# Patient Record
Sex: Female | Born: 1955 | Race: Black or African American | Hispanic: No | Smoking: Never smoker
Health system: Southern US, Community
[De-identification: ages and names within clinical notes are randomized; demographics above are authoritative.]

## PROBLEM LIST (undated history)

## (undated) ENCOUNTER — Ambulatory Visit: Admission: EM | Discharge status: Absent without leave | Payer: Self-pay

## (undated) DIAGNOSIS — Z9889 Other specified postprocedural states: Secondary | ICD-10-CM

## (undated) DIAGNOSIS — D649 Anemia, unspecified: Secondary | ICD-10-CM

## (undated) DIAGNOSIS — C50919 Malignant neoplasm of unspecified site of unspecified female breast: Secondary | ICD-10-CM

## (undated) DIAGNOSIS — Z923 Personal history of irradiation: Secondary | ICD-10-CM

## (undated) HISTORY — PX: BREAST LUMPECTOMY: SHX2

## (undated) HISTORY — DX: Malignant neoplasm of unspecified site of unspecified female breast: C50.919

## (undated) HISTORY — PX: ROTATOR CUFF REPAIR: SHX139

## (undated) HISTORY — PX: ABDOMINAL HYSTERECTOMY: SHX81

## (undated) HISTORY — DX: Other specified postprocedural states: Z98.890

## (undated) HISTORY — PX: BREAST SURGERY: SHX581

## (undated) HISTORY — DX: Anemia, unspecified: D64.9

## (undated) HISTORY — PX: CHOLECYSTECTOMY: SHX55

---

## 1998-10-06 ENCOUNTER — Encounter: Admission: RE | Admit: 1998-10-06 | Discharge: 1998-10-06 | Payer: Self-pay | Admitting: Obstetrics

## 1998-10-18 ENCOUNTER — Ambulatory Visit (HOSPITAL_COMMUNITY): Admission: RE | Admit: 1998-10-18 | Discharge: 1998-10-18 | Payer: Self-pay

## 2003-01-18 ENCOUNTER — Emergency Department (HOSPITAL_COMMUNITY): Admission: EM | Admit: 2003-01-18 | Discharge: 2003-01-18 | Payer: Self-pay | Admitting: Emergency Medicine

## 2003-01-19 ENCOUNTER — Emergency Department (HOSPITAL_COMMUNITY): Admission: EM | Admit: 2003-01-19 | Discharge: 2003-01-19 | Payer: Self-pay | Admitting: Emergency Medicine

## 2006-10-29 ENCOUNTER — Emergency Department (HOSPITAL_COMMUNITY): Admission: EM | Admit: 2006-10-29 | Discharge: 2006-10-29 | Payer: Self-pay | Admitting: Emergency Medicine

## 2009-11-22 ENCOUNTER — Encounter: Admission: RE | Admit: 2009-11-22 | Discharge: 2009-11-22 | Payer: Self-pay | Admitting: Obstetrics and Gynecology

## 2009-11-24 ENCOUNTER — Encounter: Admission: RE | Admit: 2009-11-24 | Discharge: 2009-11-24 | Payer: Self-pay | Admitting: Obstetrics and Gynecology

## 2009-11-29 ENCOUNTER — Encounter: Admission: RE | Admit: 2009-11-29 | Discharge: 2009-11-29 | Payer: Self-pay | Admitting: Obstetrics and Gynecology

## 2009-12-02 ENCOUNTER — Ambulatory Visit: Payer: Self-pay | Admitting: Oncology

## 2009-12-07 LAB — CBC WITH DIFFERENTIAL/PLATELET
BASO%: 0.2 % (ref 0.0–2.0)
Basophils Absolute: 0 10*3/uL (ref 0.0–0.1)
EOS%: 2.8 % (ref 0.0–7.0)
Eosinophils Absolute: 0.2 10*3/uL (ref 0.0–0.5)
HCT: 36.6 % (ref 34.8–46.6)
HGB: 10.8 g/dL — ABNORMAL LOW (ref 11.6–15.9)
LYMPH%: 28.5 % (ref 14.0–49.7)
MCH: 19.4 pg — ABNORMAL LOW (ref 25.1–34.0)
MCHC: 29.5 g/dL — ABNORMAL LOW (ref 31.5–36.0)
MCV: 65.7 fL — ABNORMAL LOW (ref 79.5–101.0)
MONO#: 0.4 10*3/uL (ref 0.1–0.9)
MONO%: 6.9 % (ref 0.0–14.0)
NEUT#: 3.5 10*3/uL (ref 1.5–6.5)
NEUT%: 61.6 % (ref 38.4–76.8)
Platelets: 289 10*3/uL (ref 145–400)
RBC: 5.57 10*6/uL — ABNORMAL HIGH (ref 3.70–5.45)
RDW: 25.5 % — ABNORMAL HIGH (ref 11.2–14.5)
WBC: 5.7 10*3/uL (ref 3.9–10.3)
lymph#: 1.6 10*3/uL (ref 0.9–3.3)

## 2009-12-07 LAB — COMPREHENSIVE METABOLIC PANEL
ALT: 13 U/L (ref 0–35)
AST: 14 U/L (ref 0–37)
Albumin: 4.1 g/dL (ref 3.5–5.2)
Alkaline Phosphatase: 43 U/L (ref 39–117)
BUN: 11 mg/dL (ref 6–23)
CO2: 24 mEq/L (ref 19–32)
Calcium: 9.3 mg/dL (ref 8.4–10.5)
Chloride: 105 mEq/L (ref 96–112)
Creatinine, Ser: 0.73 mg/dL (ref 0.40–1.20)
Glucose, Bld: 102 mg/dL — ABNORMAL HIGH (ref 70–99)
Potassium: 4.5 mEq/L (ref 3.5–5.3)
Sodium: 137 mEq/L (ref 135–145)
Total Bilirubin: 0.2 mg/dL — ABNORMAL LOW (ref 0.3–1.2)
Total Protein: 7.4 g/dL (ref 6.0–8.3)

## 2009-12-07 LAB — LACTATE DEHYDROGENASE: LDH: 158 U/L (ref 94–250)

## 2009-12-07 LAB — CANCER ANTIGEN 27.29: CA 27.29: 60 U/mL — ABNORMAL HIGH (ref 0–39)

## 2009-12-12 ENCOUNTER — Ambulatory Visit (HOSPITAL_COMMUNITY): Admission: RE | Admit: 2009-12-12 | Discharge: 2009-12-12 | Payer: Self-pay | Admitting: Oncology

## 2009-12-13 ENCOUNTER — Encounter: Admission: RE | Admit: 2009-12-13 | Discharge: 2009-12-13 | Payer: Self-pay | Admitting: Oncology

## 2009-12-14 ENCOUNTER — Encounter: Payer: Self-pay | Admitting: Oncology

## 2009-12-14 ENCOUNTER — Ambulatory Visit: Admission: RE | Admit: 2009-12-14 | Discharge: 2009-12-14 | Payer: Self-pay | Admitting: Oncology

## 2009-12-14 ENCOUNTER — Ambulatory Visit: Payer: Self-pay | Admitting: Cardiology

## 2009-12-19 ENCOUNTER — Encounter (INDEPENDENT_AMBULATORY_CARE_PROVIDER_SITE_OTHER): Payer: Self-pay | Admitting: Obstetrics and Gynecology

## 2009-12-19 ENCOUNTER — Inpatient Hospital Stay (HOSPITAL_COMMUNITY): Admission: RE | Admit: 2009-12-19 | Discharge: 2009-12-22 | Payer: Self-pay | Admitting: Obstetrics and Gynecology

## 2010-01-19 ENCOUNTER — Ambulatory Visit: Payer: Self-pay | Admitting: Oncology

## 2010-03-07 ENCOUNTER — Ambulatory Visit: Payer: Self-pay | Admitting: Oncology

## 2010-03-09 LAB — COMPREHENSIVE METABOLIC PANEL
ALT: 11 U/L (ref 0–35)
Albumin: 4.3 g/dL (ref 3.5–5.2)
Alkaline Phosphatase: 51 U/L (ref 39–117)
BUN: 16 mg/dL (ref 6–23)
CO2: 25 mEq/L (ref 19–32)
Calcium: 10 mg/dL (ref 8.4–10.5)
Chloride: 102 mEq/L (ref 96–112)
Glucose, Bld: 93 mg/dL (ref 70–99)
Potassium: 4.5 mEq/L (ref 3.5–5.3)
Sodium: 138 mEq/L (ref 135–145)
Total Bilirubin: 0.2 mg/dL — ABNORMAL LOW (ref 0.3–1.2)
Total Protein: 7.9 g/dL (ref 6.0–8.3)

## 2010-03-09 LAB — CBC WITH DIFFERENTIAL/PLATELET
BASO%: 0.3 % (ref 0.0–2.0)
Basophils Absolute: 0 10*3/uL (ref 0.0–0.1)
EOS%: 2.3 % (ref 0.0–7.0)
Eosinophils Absolute: 0.1 10*3/uL (ref 0.0–0.5)
HCT: 35.5 % (ref 34.8–46.6)
HGB: 11.3 g/dL — ABNORMAL LOW (ref 11.6–15.9)
LYMPH%: 24.8 % (ref 14.0–49.7)
MCH: 22.1 pg — ABNORMAL LOW (ref 25.1–34.0)
MCHC: 32 g/dL (ref 31.5–36.0)
MCV: 68.9 fL — ABNORMAL LOW (ref 79.5–101.0)
MONO#: 0.3 10*3/uL (ref 0.1–0.9)
NEUT#: 3.6 10*3/uL (ref 1.5–6.5)
NEUT%: 66.8 % (ref 38.4–76.8)
RBC: 5.14 10*6/uL (ref 3.70–5.45)
RDW: 16.9 % — ABNORMAL HIGH (ref 11.2–14.5)
WBC: 5.3 10*3/uL (ref 3.9–10.3)
lymph#: 1.3 10*3/uL (ref 0.9–3.3)

## 2010-03-16 ENCOUNTER — Other Ambulatory Visit: Payer: Self-pay | Admitting: Oncology

## 2010-03-16 ENCOUNTER — Encounter: Payer: BC Managed Care – PPO | Admitting: Oncology

## 2010-03-16 DIAGNOSIS — Z853 Personal history of malignant neoplasm of breast: Secondary | ICD-10-CM

## 2010-03-29 ENCOUNTER — Ambulatory Visit
Admission: RE | Admit: 2010-03-29 | Discharge: 2010-03-29 | Disposition: A | Discharge status: Home / Self Care | Payer: BC Managed Care – PPO | Source: Ambulatory Visit | Attending: Oncology | Admitting: Oncology

## 2010-03-29 ENCOUNTER — Encounter (INDEPENDENT_AMBULATORY_CARE_PROVIDER_SITE_OTHER): Payer: BC Managed Care – PPO | Admitting: Vascular Surgery

## 2010-03-29 DIAGNOSIS — I781 Nevus, non-neoplastic: Secondary | ICD-10-CM

## 2010-03-29 DIAGNOSIS — Z853 Personal history of malignant neoplasm of breast: Secondary | ICD-10-CM

## 2010-03-30 NOTE — Assessment & Plan Note (Signed)
OFFICE VISIT  [name], [name] DOB:  Mar 28, 1955                                       03/29/2010 FAOZH#:[id number]  This patient presents today for concern regarding venous pathology in her lower extremities bilaterally.  She is a very pleasant 55 year old white female with discomfort over a very pronounced telangiectasia over her lateral and medial thighs.  She works as a  C.R.N.A. Scientist, clinical (histocompatibility and immunogenetics) and stands for great periods of time.  She does report pain, itching and burning sensation over these with prolonged standing.  She does not have any history of DVT or other more serious venous pathology.  PAST MEDICAL HISTORY:  Significant for breast cancer for which is not currently receiving chemotherapy with a plan for eventual lumpectomy. She also has a history of total hysterectomy.  SOCIAL HISTORY:  She is single with 4 children.  She does not smoke or drink alcohol.  FAMILY HISTORY:  Positive for coronary bypass grafting in her mother.  REVIEW OF SYSTEMS:  No weight loss or gain.  She weighs 176 pounds.  She is 5 foot 1 inch tall.  Review of systems otherwise negative.  PHYSICAL EXAMINATION:  Well developed, well nourished black female appearing stated age in no acute distress.  Blood pressure is 125/82, pulse 83, respirations 18.  HEENT:  Normal.  She has 2+ radial and 2+ dorsalis pedis pulses bilaterally.  Musculoskeletal:  Shows no major deformity or cyanosis.  Neurologic:  No focal paresthesias.  Skin: Without ulcers or rashes.  She does have extensive raised telangiectasia over both lateral thighs and medial thighs.  I imaged her veins with SonoSite ultrasound and this shows no evidence of gross reflux in her saphenous veins bilaterally.  I discussed this at length with the patient.  I explained that she does not have a more serious underlying venous putting her at any increased risk for PE and or other more serious problems, I did explain that  these telangiectasia could be treated with sclerotherapy.  She wishes to proceed with this. She understands this would not be covered by insurance and I did explain the significant amount of out of  pocket expense.  We will schedule this at her convenience.    Larina Earthly, M.D.  TFE/MEDQ  D:  03/29/2010  T:  03/30/2010  Job:  [id number]

## 2010-04-25 LAB — COMPREHENSIVE METABOLIC PANEL
ALT: 16 U/L (ref 0–35)
AST: 20 U/L (ref 0–37)
Albumin: 4.1 g/dL (ref 3.5–5.2)
Alkaline Phosphatase: 40 U/L (ref 39–117)
BUN: 5 mg/dL — ABNORMAL LOW (ref 6–23)
CO2: 27 mEq/L (ref 19–32)
Calcium: 9.5 mg/dL (ref 8.4–10.5)
Chloride: 104 mEq/L (ref 96–112)
Creatinine, Ser: 0.59 mg/dL (ref 0.4–1.2)
GFR calc Af Amer: >60 mL/min (ref >60)
GFR calc non Af Amer: >60 mL/min (ref >60)
Glucose, Bld: 85 mg/dL (ref 70–99)
Potassium: 3.8 mEq/L (ref 3.5–5.1)
Sodium: 136 mEq/L (ref 135–145)
Total Bilirubin: 0.4 mg/dL (ref 0.3–1.2)
Total Protein: 7.9 g/dL (ref 6.0–8.3)

## 2010-04-25 LAB — CBC
HCT: 30.5 % — ABNORMAL LOW (ref 36.0–46.0)
HCT: 36.2 % (ref 36.0–46.0)
Hemoglobin: 11.1 g/dL — ABNORMAL LOW (ref 12.0–15.0)
Hemoglobin: 9.5 g/dL — ABNORMAL LOW (ref 12.0–15.0)
MCH: 20.2 pg — ABNORMAL LOW (ref 26.0–34.0)
MCH: 20.8 pg — ABNORMAL LOW (ref 26.0–34.0)
MCHC: 30.6 g/dL (ref 30.0–36.0)
MCHC: 31.2 g/dL (ref 30.0–36.0)
MCV: 66 fL — ABNORMAL LOW (ref 78.0–100.0)
MCV: 66.5 fL — ABNORMAL LOW (ref 78.0–100.0)
Platelets: 171 10*3/uL (ref 150–400)
Platelets: 214 10*3/uL (ref 150–400)
RBC: 4.59 MIL/uL (ref 3.87–5.11)
RBC: 5.49 MIL/uL — ABNORMAL HIGH (ref 3.87–5.11)
RDW: 27.3 % — ABNORMAL HIGH (ref 11.5–15.5)
RDW: 28 % — ABNORMAL HIGH (ref 11.5–15.5)
WBC: 4.5 10*3/uL (ref 4.0–10.5)
WBC: 7.5 10*3/uL (ref 4.0–10.5)

## 2010-04-25 LAB — PREGNANCY, URINE: Preg Test, Ur: NEGATIVE

## 2010-04-25 LAB — SURGICAL PCR SCREEN
MRSA, PCR: NEGATIVE
Staphylococcus aureus: NEGATIVE

## 2010-04-26 LAB — GLUCOSE, CAPILLARY: Glucose-Capillary: 101 mg/dL — ABNORMAL HIGH (ref 70–99)

## 2010-05-10 ENCOUNTER — Other Ambulatory Visit: Payer: Self-pay | Admitting: Oncology

## 2010-05-10 ENCOUNTER — Encounter (HOSPITAL_BASED_OUTPATIENT_CLINIC_OR_DEPARTMENT_OTHER): Payer: BC Managed Care – PPO | Admitting: Oncology

## 2010-05-10 DIAGNOSIS — C50119 Malignant neoplasm of central portion of unspecified female breast: Secondary | ICD-10-CM

## 2010-05-10 LAB — CBC WITH DIFFERENTIAL/PLATELET
Basophils Absolute: 0.1 10*3/uL (ref 0.0–0.1)
EOS%: 2.4 % (ref 0.0–7.0)
Eosinophils Absolute: 0.1 10*3/uL (ref 0.0–0.5)
HCT: 38.2 % (ref 34.8–46.6)
HGB: 11.9 g/dL (ref 11.6–15.9)
LYMPH%: 23.4 % (ref 14.0–49.7)
MCH: 22.3 pg — ABNORMAL LOW (ref 25.1–34.0)
MCHC: 31.2 g/dL — ABNORMAL LOW (ref 31.5–36.0)
MCV: 71.4 fL — ABNORMAL LOW (ref 79.5–101.0)
MONO#: 0.3 10*3/uL (ref 0.1–0.9)
NEUT#: 3.9 10*3/uL (ref 1.5–6.5)
NEUT%: 67.4 % (ref 38.4–76.8)
RBC: 5.36 10*6/uL (ref 3.70–5.45)
RDW: 17.8 % — ABNORMAL HIGH (ref 11.2–14.5)
WBC: 5.7 10*3/uL (ref 3.9–10.3)
lymph#: 1.3 10*3/uL (ref 0.9–3.3)

## 2010-05-11 LAB — COMPREHENSIVE METABOLIC PANEL
ALT: 17 U/L (ref 0–35)
AST: 19 U/L (ref 0–37)
Alkaline Phosphatase: 55 U/L (ref 39–117)
CO2: 26 mEq/L (ref 19–32)
Calcium: 9.8 mg/dL (ref 8.4–10.5)
Chloride: 102 mEq/L (ref 96–112)
Creatinine, Ser: 0.94 mg/dL (ref 0.40–1.20)
Glucose, Bld: 98 mg/dL (ref 70–99)
Potassium: 4.4 mEq/L (ref 3.5–5.3)
Sodium: 137 mEq/L (ref 135–145)
Total Bilirubin: 0.3 mg/dL (ref 0.3–1.2)
Total Protein: 7.5 g/dL (ref 6.0–8.3)

## 2010-05-11 LAB — VITAMIN D 25 HYDROXY (VIT D DEFICIENCY, FRACTURES): Vit D, 25-Hydroxy: 16 ng/mL — ABNORMAL LOW (ref 30–89)

## 2010-05-22 ENCOUNTER — Other Ambulatory Visit: Payer: Self-pay | Admitting: General Surgery

## 2010-05-22 DIAGNOSIS — C50911 Malignant neoplasm of unspecified site of right female breast: Secondary | ICD-10-CM

## 2010-06-13 ENCOUNTER — Ambulatory Visit
Admission: RE | Admit: 2010-06-13 | Discharge: 2010-06-13 | Disposition: A | Discharge status: Home / Self Care | Payer: BC Managed Care – PPO | Source: Ambulatory Visit | Attending: General Surgery | Admitting: General Surgery

## 2010-06-13 DIAGNOSIS — C50911 Malignant neoplasm of unspecified site of right female breast: Secondary | ICD-10-CM

## 2010-06-13 MED ORDER — GADOBENATE DIMEGLUMINE 529 MG/ML IV SOLN
18.0000 mL | Freq: Once | INTRAVENOUS | Status: AC | PRN
  Administered 2010-06-13: 18 mL via INTRAVENOUS

## 2010-06-19 ENCOUNTER — Other Ambulatory Visit (HOSPITAL_COMMUNITY): Payer: Self-pay | Admitting: General Surgery

## 2010-06-19 ENCOUNTER — Other Ambulatory Visit: Payer: Self-pay | Admitting: General Surgery

## 2010-06-19 DIAGNOSIS — C50919 Malignant neoplasm of unspecified site of unspecified female breast: Secondary | ICD-10-CM

## 2010-06-19 DIAGNOSIS — C50911 Malignant neoplasm of unspecified site of right female breast: Secondary | ICD-10-CM

## 2010-06-20 ENCOUNTER — Encounter (INDEPENDENT_AMBULATORY_CARE_PROVIDER_SITE_OTHER): Payer: Self-pay | Admitting: General Surgery

## 2010-07-17 ENCOUNTER — Encounter (HOSPITAL_BASED_OUTPATIENT_CLINIC_OR_DEPARTMENT_OTHER)
Admission: RE | Admit: 2010-07-17 | Discharge: 2010-07-17 | Disposition: A | Discharge status: Home / Self Care | Payer: BC Managed Care – PPO | Source: Ambulatory Visit | Attending: General Surgery | Admitting: General Surgery

## 2010-07-17 ENCOUNTER — Other Ambulatory Visit (HOSPITAL_COMMUNITY): Payer: BC Managed Care – PPO

## 2010-07-17 LAB — CBC
Hemoglobin: 13.1 g/dL (ref 12.0–15.0)
MCH: 22.8 pg — ABNORMAL LOW (ref 26.0–34.0)
RBC: 5.74 MIL/uL — ABNORMAL HIGH (ref 3.87–5.11)
WBC: 7.4 10*3/uL (ref 4.0–10.5)

## 2010-07-17 LAB — COMPREHENSIVE METABOLIC PANEL
AST: 20 U/L (ref 0–37)
Albumin: 3.6 g/dL (ref 3.5–5.2)
BUN: 14 mg/dL (ref 6–23)
Calcium: 9.9 mg/dL (ref 8.4–10.5)
Creatinine, Ser: 0.99 mg/dL (ref 0.4–1.2)
GFR calc Af Amer: >60 mL/min (ref >60)
Total Protein: 7.8 g/dL (ref 6.0–8.3)

## 2010-07-17 LAB — URINE MICROSCOPIC-ADD ON

## 2010-07-17 LAB — URINALYSIS, ROUTINE W REFLEX MICROSCOPIC
Glucose, UA: NEGATIVE mg/dL
Ketones, ur: NEGATIVE mg/dL
Specific Gravity, Urine: 1.024 (ref 1.005–1.030)
pH: 5 (ref 5.0–8.0)

## 2010-07-17 LAB — DIFFERENTIAL
Basophils Relative: 0 % (ref 0–1)
Monocytes Relative: 6 % (ref 3–12)
Neutro Abs: 4.3 10*3/uL (ref 1.7–7.7)
Neutrophils Relative %: 58 % (ref 43–77)

## 2010-07-18 ENCOUNTER — Ambulatory Visit (HOSPITAL_COMMUNITY)
Admission: RE | Admit: 2010-07-18 | Discharge: 2010-07-18 | Disposition: A | Discharge status: Home / Self Care | Payer: BC Managed Care – PPO | Source: Ambulatory Visit | Attending: General Surgery | Admitting: General Surgery

## 2010-07-18 ENCOUNTER — Other Ambulatory Visit (INDEPENDENT_AMBULATORY_CARE_PROVIDER_SITE_OTHER): Payer: Self-pay | Admitting: General Surgery

## 2010-07-18 ENCOUNTER — Ambulatory Visit
Admission: RE | Admit: 2010-07-18 | Discharge: 2010-07-18 | Disposition: A | Discharge status: Home / Self Care | Payer: BC Managed Care – PPO | Source: Ambulatory Visit | Attending: General Surgery | Admitting: General Surgery

## 2010-07-18 ENCOUNTER — Ambulatory Visit (HOSPITAL_BASED_OUTPATIENT_CLINIC_OR_DEPARTMENT_OTHER)
Admission: RE | Admit: 2010-07-18 | Discharge: 2010-07-18 | Disposition: A | Discharge status: Home / Self Care | Payer: BC Managed Care – PPO | Source: Ambulatory Visit | Attending: General Surgery | Admitting: General Surgery

## 2010-07-18 DIAGNOSIS — C50919 Malignant neoplasm of unspecified site of unspecified female breast: Secondary | ICD-10-CM | POA: Insufficient documentation

## 2010-07-18 DIAGNOSIS — Z01818 Encounter for other preprocedural examination: Secondary | ICD-10-CM | POA: Insufficient documentation

## 2010-07-18 DIAGNOSIS — C50911 Malignant neoplasm of unspecified site of right female breast: Secondary | ICD-10-CM

## 2010-07-18 DIAGNOSIS — Z01812 Encounter for preprocedural laboratory examination: Secondary | ICD-10-CM | POA: Insufficient documentation

## 2010-07-18 MED ORDER — TECHNETIUM TC 99M SULFUR COLLOID FILTERED
1.0000 | Freq: Once | INTRAVENOUS | Status: AC | PRN
  Administered 2010-07-18: 1 via INTRADERMAL

## 2010-07-20 NOTE — Op Note (Addendum)
[name], [name]           ACCOUNT NO.:  [id number]  MEDICAL RECORD NO.:  [id number]  LOCATION:  [facility]                          FACILITY:  [facility]  PHYSICIAN:  Angelia Mould. Derrell Lolling, M.D.DATE OF BIRTH:  [birth year]  DATE OF PROCEDURE:  07/18/2010 DATE OF DISCHARGE:                              OPERATIVE REPORT   PREOPERATIVE DIAGNOSES: 1. Invasive ductal carcinoma right breast, multifocal, superior and     central, clinical stage T1bN0. 2. Status post neoadjuvant Femara.  POSTOPERATIVE DIAGNOSES: 1. Invasive ductal carcinoma right breast, multifocal, superior and     central, clinical stage T1bN0. 2. Status post neoadjuvant Femara.  OPERATION PERFORMED: 1. Inject blue dye right breast. 2. Right partial mastectomy with needle localization x2, right     axillary sentinel lymph node mapping and biopsy.  SURGEON:  Angelia Mould. Derrell Lolling, MD  OPERATIVE INDICATIONS:  This is a 56 year old African American femalewho presented in October 2011, with a palpable mass in the right breast at 12 o'clock position.  Imaging at that time showed a 2.3-cm mass which was biopsied and showed invasive ductal carcinoma.  More anteriorly and almost behind the nipple areolar complex was a second area measuring 1.3 cm.  She also had a problem with a huge uterus with uterine fibroids and heavy menstrual bleeding.  She had an MRI of the breast which showed extensive disease in the central and upper part of the right breast with anterior to posterior dimension outside rim to outside rim being 7 cm. In an attempt to facilitate breast conservation surgery, she has been on neoadjuvant Femara since that time.  The tumors had been down-staged fairly significantly.  The more superior and posterior mass at 12 o'clock position was now 0.9 cm, the second focus which was anterior and retroareolar was essentially resolved with only a 3-mm focus and biopsy clips present.  There was no adenopathy.  She wanted to attempt  breast conservation surgery.  She is aware that she will lose a significant volume of tissue from the superior pole of the breast.  She is going to undergo adjuvant radiation therapy.  She is brought to the operating room electively.  OPERATIVE TECHNIQUE:  The patient underwent wire localization x2 of the two small tumors in the superior aspect of the right breast.  With these wires entered anteriorly and directed posteriorly and inferiorly and were well placed around the tumors and marker clips.  She was brought to the holding area at [facility].  The nuclear medicine technician injected radionuclide into the breast.  The patient was taken to the operating room.  General anesthesia was induced.  Intravenous antibiotics were given.  Surgical time-out was held identifying the correct patient, correct procedure and correct site.  Prior to prepping and draping of the breast, I injected the right breast retroareolar area with 5 mL of blue dye, which was a mixture of 2 mL of methylene blue mixed with 3 mL of saline.  The breast was massaged for 5 minutes.  We then prepped and draped the right breast and right axilla. Marcaine 0.5% with epinephrine was used for local infiltration anesthetic.  After review of the specimen mammogram films, I used a marking pen  and marked a radially oriented ellipse superiorly at about the 12:30 position.  The incision was made and dissection was carried down into the breast tissue around the localizing wires.  I went all the way down to the pectoralis fascia because the more superiorly placed mass was very posteriorly.  Inferiorly, we came very close to the wire tip and so I took the specimen out margin with the six color margin marker kit and then re-excised the inferior margin and marked it as well.  The specimen mammogram actually showed that both marker clips and both wires were completely encased within the specimen.  It was a little bit  close on the inferior margin so I did feel justified in taking more inferior tissue.  This was behind the retroareolar area.  Hemostasis was excellent and achieved with electrocautery.  We felt that we had done adequate excision.  We reconstructed the breast tissue as best as possible.  We undermined the breast tissue in pectoralis fascia and brought it together with interrupted sutures of 3-0 Vicryl and in the deep layer.  The more superficially placed breast tissue was closed with interrupted suture of 3-0 Vicryl and the skin closed with running subcuticular suture of 4-0 Monocryl and Dermabond.  There was significant flattening of the superior half of the areolar margin.  We then used the Neoprobe and identified the radioactivity in the axilla.  A transverse incision was made at the hairline.  Dissection was carried down through the subcutaneous tissue and through the clavipectoral fascia.  We dissected out two very hot, very blue lymph nodes and sent those as sentinel node #1 and #2.  We thought that she will be having some other sentinel nodes but when we removed them they had no radioactivity so we simply sent those specimens as additional axillary contents, non sentinel node.  Hemostasis was excellent and achieved with electrocautery.  The deeper axillary tissues were closed with interrupted sutures of 3-0 Vicryl and the skin closed with a running subcuticular suture of 4-0 Monocryl and Dermabond.  Clean bandages were placed and the patient was taken to recovery room in stable condition.  Estimated blood loss was about 25 mL.  Complications none.  Sponge, needle and instrument counts were correct.     Angelia Mould. Derrell Lolling, M.D.   ______________________________ Angelia Mould. Derrell Lolling, M.D.    HMI/MEDQ  D:  07/18/2010  T:  07/19/2010  Job:  [id number]  cc:   Pierce Crane, M.D., F.R.C.P.C. Dineen Kid Rana Snare, M.D.  Electronically Signed by Claud Kelp M.D. on 07/27/2010 05:22:52 PM

## 2010-08-14 ENCOUNTER — Ambulatory Visit
Admission: RE | Admit: 2010-08-14 | Discharge: 2010-08-14 | Disposition: A | Discharge status: Home / Self Care | Payer: BC Managed Care – PPO | Source: Ambulatory Visit | Attending: Radiation Oncology | Admitting: Radiation Oncology

## 2010-08-14 DIAGNOSIS — L538 Other specified erythematous conditions: Secondary | ICD-10-CM | POA: Insufficient documentation

## 2010-08-14 DIAGNOSIS — C50219 Malignant neoplasm of upper-inner quadrant of unspecified female breast: Secondary | ICD-10-CM | POA: Insufficient documentation

## 2010-08-14 DIAGNOSIS — Z510 Encounter for antineoplastic radiation therapy: Secondary | ICD-10-CM | POA: Insufficient documentation

## 2010-08-14 DIAGNOSIS — C50119 Malignant neoplasm of central portion of unspecified female breast: Secondary | ICD-10-CM | POA: Insufficient documentation

## 2010-08-31 ENCOUNTER — Encounter (HOSPITAL_BASED_OUTPATIENT_CLINIC_OR_DEPARTMENT_OTHER): Payer: BC Managed Care – PPO | Admitting: Oncology

## 2010-08-31 ENCOUNTER — Ambulatory Visit: Payer: BC Managed Care – PPO | Admitting: Gynecology

## 2010-08-31 ENCOUNTER — Other Ambulatory Visit: Payer: Self-pay | Admitting: Oncology

## 2010-08-31 ENCOUNTER — Encounter (INDEPENDENT_AMBULATORY_CARE_PROVIDER_SITE_OTHER): Payer: BC Managed Care – PPO | Admitting: General Surgery

## 2010-08-31 DIAGNOSIS — C50119 Malignant neoplasm of central portion of unspecified female breast: Secondary | ICD-10-CM

## 2010-08-31 LAB — CBC WITH DIFFERENTIAL/PLATELET
BASO%: 0.5 % (ref 0.0–2.0)
Basophils Absolute: 0 10*3/uL (ref 0.0–0.1)
Eosinophils Absolute: 0.2 10*3/uL (ref 0.0–0.5)
HCT: 38.1 % (ref 34.8–46.6)
HGB: 12.2 g/dL (ref 11.6–15.9)
MONO#: 0.3 10*3/uL (ref 0.1–0.9)
NEUT#: 2.1 10*3/uL (ref 1.5–6.5)
NEUT%: 54.4 % (ref 38.4–76.8)
WBC: 3.9 10*3/uL (ref 3.9–10.3)
lymph#: 1.3 10*3/uL (ref 0.9–3.3)

## 2010-08-31 LAB — COMPREHENSIVE METABOLIC PANEL
ALT: 15 U/L (ref 0–35)
BUN: 12 mg/dL (ref 6–23)
CO2: 23 mEq/L (ref 19–32)
Calcium: 9.7 mg/dL (ref 8.4–10.5)
Chloride: 104 mEq/L (ref 96–112)
Creatinine, Ser: 0.94 mg/dL (ref 0.50–1.10)
Glucose, Bld: 85 mg/dL (ref 70–99)

## 2010-08-31 LAB — LACTATE DEHYDROGENASE: LDH: 145 U/L (ref 94–250)

## 2010-09-11 ENCOUNTER — Encounter (INDEPENDENT_AMBULATORY_CARE_PROVIDER_SITE_OTHER): Payer: Self-pay | Admitting: General Surgery

## 2010-09-11 ENCOUNTER — Ambulatory Visit (INDEPENDENT_AMBULATORY_CARE_PROVIDER_SITE_OTHER): Payer: BC Managed Care – PPO | Admitting: General Surgery

## 2010-09-11 DIAGNOSIS — C50919 Malignant neoplasm of unspecified site of unspecified female breast: Secondary | ICD-10-CM

## 2010-09-11 DIAGNOSIS — C50911 Malignant neoplasm of unspecified site of right female breast: Secondary | ICD-10-CM

## 2010-09-11 NOTE — Progress Notes (Signed)
Subjective:     Patient ID: [name], female   DOB: 09-Apr-1955, 55 y.o.   MRN: [id number]  HPI Patient is doing well. She has invasive cancer of the right breast. She underwent neoadjuvant chemotherapy and then extensive right partial mastectomy with needle localization x2, right axillary sentinel node biopsy on July 18, 2010.  Her pathologic stage was YP T1 C., YPN0, HER-2 negative, receptor positive.  She no complaints about her wounds. Doesn't complain about her axilla. Her only complaint is pain on top of her right shoulder when she  elevates her arm. It is becoming a chronic problem.  She started her adjuvant radiation therapy on July 18 under the guidance of Dr. Roselind Messier. She has seen Dr. Caron Presume and we'll see him every 3 months. Review of Systems     Objective:   Physical Exam Patient looks well in no distress. She is in good spirits.  Right breast incision and right axilla incision have healed nicely. The tissues are soft. The contour and cosmetic result are very good. Symmetry is excellent. Nipple projection is symmetrical. Her breasts are ptotic.  She does have pain on top of her right shoulder when she tries to elevate her hand above the level of her eyes.   Assessment:     Invasive ductal carcinoma right breast, pathologic stage YP T1 C., YP N0, HER-2 negative, receptor positive.  Doing well 6 weeks following right partial mastectomy and sentinel node biopsy.    Plan:     Continue with adjuvant radiation therapy.  Continue with adjuvant Femara.  Return to see me in 6 months.  She is referred to orthopedic surgery for evaluation of her shoulder, which sounds like a rotator cuff injury.

## 2010-09-11 NOTE — Patient Instructions (Signed)
All of your wounds have healed very nicely. You may return to work. I agree with the radiation therapy as well as the adjuvant femara medication. As we discussed, I would see your orthopedic surgeon about your right shoulder symptoms which sound like rotator cuff injury. I will see you back in 6 months.

## 2010-09-14 ENCOUNTER — Encounter (INDEPENDENT_AMBULATORY_CARE_PROVIDER_SITE_OTHER): Payer: BC Managed Care – PPO | Admitting: General Surgery

## 2010-09-25 ENCOUNTER — Encounter (INDEPENDENT_AMBULATORY_CARE_PROVIDER_SITE_OTHER): Payer: BC Managed Care – PPO | Admitting: General Surgery

## 2010-11-13 ENCOUNTER — Ambulatory Visit
Admission: RE | Admit: 2010-11-13 | Discharge: 2010-11-13 | Disposition: A | Discharge status: Home / Self Care | Payer: BC Managed Care – PPO | Source: Ambulatory Visit | Attending: Radiation Oncology | Admitting: Radiation Oncology

## 2010-11-23 ENCOUNTER — Other Ambulatory Visit: Payer: Self-pay | Admitting: Oncology

## 2010-11-23 ENCOUNTER — Encounter (HOSPITAL_BASED_OUTPATIENT_CLINIC_OR_DEPARTMENT_OTHER): Payer: BC Managed Care – PPO | Admitting: Oncology

## 2010-11-23 DIAGNOSIS — C50919 Malignant neoplasm of unspecified site of unspecified female breast: Secondary | ICD-10-CM

## 2010-11-23 DIAGNOSIS — C50119 Malignant neoplasm of central portion of unspecified female breast: Secondary | ICD-10-CM

## 2010-11-23 DIAGNOSIS — C50219 Malignant neoplasm of upper-inner quadrant of unspecified female breast: Secondary | ICD-10-CM

## 2010-11-23 LAB — CBC
Hemoglobin: 10.7 — ABNORMAL LOW
RBC: 4.85
WBC: 4.6

## 2010-11-23 LAB — DIFFERENTIAL
Basophils Relative: 1
Lymphocytes Relative: 26
Monocytes Relative: 5
Neutro Abs: 3.1

## 2010-11-23 LAB — BASIC METABOLIC PANEL
CO2: 23
Calcium: 8.9
GFR calc Af Amer: >60
GFR calc non Af Amer: >60
Sodium: 138

## 2010-11-23 LAB — WET PREP, GENITAL: Clue Cells Wet Prep HPF POC: NONE SEEN

## 2010-11-23 LAB — URINALYSIS, ROUTINE W REFLEX MICROSCOPIC
Glucose, UA: NEGATIVE
Leukocytes, UA: NEGATIVE
Protein, ur: NEGATIVE
pH: 6

## 2010-11-23 LAB — CBC WITH DIFFERENTIAL/PLATELET
EOS%: 2.6 % (ref 0.0–7.0)
Eosinophils Absolute: 0.1 10*3/uL (ref 0.0–0.5)
MCV: 73.7 fL — ABNORMAL LOW (ref 79.5–101.0)
MONO%: 9 % (ref 0.0–14.0)
NEUT#: 2.7 10*3/uL (ref 1.5–6.5)
RBC: 5.12 10*6/uL (ref 3.70–5.45)
RDW: 15.3 % — ABNORMAL HIGH (ref 11.2–14.5)

## 2010-11-23 LAB — URINE MICROSCOPIC-ADD ON

## 2010-11-23 LAB — RPR: RPR Ser Ql: NONREACTIVE

## 2010-11-23 LAB — GC/CHLAMYDIA PROBE AMP, GENITAL
Chlamydia, DNA Probe: NEGATIVE
GC Probe Amp, Genital: NEGATIVE

## 2010-11-23 LAB — POCT PREGNANCY, URINE: Preg Test, Ur: NEGATIVE

## 2010-11-24 LAB — COMPREHENSIVE METABOLIC PANEL
ALT: 22 U/L (ref 0–35)
CO2: 25 mEq/L (ref 19–32)
Calcium: 9.3 mg/dL (ref 8.4–10.5)
Chloride: 105 mEq/L (ref 96–112)
Glucose, Bld: 91 mg/dL (ref 70–99)
Sodium: 140 mEq/L (ref 135–145)
Total Protein: 7.2 g/dL (ref 6.0–8.3)

## 2010-11-24 LAB — CANCER ANTIGEN 27.29: CA 27.29: 38 U/mL (ref 0–39)

## 2010-12-27 ENCOUNTER — Telehealth: Payer: Self-pay | Admitting: *Deleted

## 2010-12-27 NOTE — Telephone Encounter (Signed)
Mailed out calendar to inform the patient of the new date and time in 2013

## 2011-01-10 NOTE — Progress Notes (Signed)
CC:   [name]. [name], M.D. [name] [name], M.D.  PROBLEM:  Locally advanced ER/PR positive breast cancer on neoadjuvant Femara therapy started in November 2011.  Ms. [name] returns for followup.  Since being seen last, she underwent surgery in July of 2012.  Lumpectomy took place.  She had foci of tumor measuring 1.6 x 1.6 cm.  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  Total of a 7 lymph nodes were removed, all of which were negative for malignancy.  Postoperative course was unremarkable.  She has been referred to Dr. Roselind Messier who, in turn, completed radiation therapy to her right breast October 24, 2010.  She is now being seen for followup.  Ms. [name] is doing well.  She really has no new complaints.  She is recovering well from surgery and radiation.  ECOG STATUS:  Zero.  MEDICATION LIST:  Reviewed.  She continues on Femara, Ambien, Atarax and Aleve.  She has been tolerating Femara well.  She really has had no other complaints from this.  PHYSICAL EXAMINATION:  General:  Pleasant alert woman looking stated age.  Vital signs:  Blood pressure is 116/81, temperature 98, pulse 94, respiratory rate 20, weight is 191.  HEENT:  No palpable adenopathy in the head and neck area.  Oropharynx normal.  Lungs:  Clear.  Heart: Sounds are normal.  Breasts:  Free of any obvious masses.  Surgical scars are healed well.  There is no nipple retraction or skin changes. Both axilla negative.  No palpable hepatosplenomegaly.  No inguinal adenopathy, no peripheral edema.  Neurologic:  Grossly intact.  LABORATORY STUDIES:  Labs from 10/11 are within normal limits.  Tumor marker normal.  Vitamin D is low at 23.  IMPRESSION AND PLAN:  Ms. [name] is doing well.  I have recommended she take additional vitamin D supplementation.  We will review the status of her bone density test as well.  I will plan to see her again in followup in 6 months'  time.    ______________________________ Pierce Crane, M.D., F.R.C.P.C. PR/MEDQ  D:  01/10/2011  T:  01/10/2011  Job:  [id number]

## 2011-01-26 ENCOUNTER — Telehealth: Payer: Self-pay | Admitting: Oncology

## 2011-01-26 NOTE — Telephone Encounter (Signed)
Mail patient an Programmer, systems out today.

## 2011-02-09 ENCOUNTER — Encounter: Payer: Self-pay | Admitting: Oncology

## 2011-02-09 NOTE — Progress Notes (Signed)
Patient overqualified for financial assistance for a family of one $24,757.00

## 2011-03-01 ENCOUNTER — Telehealth: Payer: Self-pay

## 2011-03-01 NOTE — Telephone Encounter (Signed)
This nurse received unsigned note on desk to call pt for "dryness around breast."  Attempted to call pt at # provided (same as listed home #).  Fax machine picked up.  Will continue to attempt to contact pt. dph

## 2011-03-26 ENCOUNTER — Encounter: Payer: Self-pay | Admitting: Oncology

## 2011-03-26 NOTE — Progress Notes (Signed)
Patient came by this Morning needing help with some of her bills,we told her that she has no more funds available in the [employer] and [facility] FUND,and so we gave her lauren number to see if she would know and resources out there that could help her.

## 2011-03-28 ENCOUNTER — Telehealth: Payer: Self-pay | Admitting: *Deleted

## 2011-03-28 NOTE — Telephone Encounter (Signed)
gave patient appointment for mammogram 04-04-2011 at 9:50am printed out calendar and gave to the patient

## 2011-03-30 ENCOUNTER — Encounter: Payer: Self-pay | Admitting: Oncology

## 2011-03-30 NOTE — Progress Notes (Signed)
Patient approve for 100% Discount, 03/30/11 - 09/27/11.

## 2011-04-03 ENCOUNTER — Other Ambulatory Visit: Payer: Self-pay | Admitting: Oncology

## 2011-04-03 DIAGNOSIS — C50919 Malignant neoplasm of unspecified site of unspecified female breast: Secondary | ICD-10-CM

## 2011-04-04 ENCOUNTER — Ambulatory Visit
Admission: RE | Admit: 2011-04-04 | Discharge: 2011-04-04 | Disposition: A | Discharge status: Home / Self Care | Payer: BC Managed Care – PPO | Source: Ambulatory Visit | Attending: Oncology | Admitting: Oncology

## 2011-04-04 ENCOUNTER — Other Ambulatory Visit: Payer: Self-pay | Admitting: Oncology

## 2011-04-04 DIAGNOSIS — C50919 Malignant neoplasm of unspecified site of unspecified female breast: Secondary | ICD-10-CM

## 2011-04-04 DIAGNOSIS — N6489 Other specified disorders of breast: Secondary | ICD-10-CM

## 2011-04-09 ENCOUNTER — Ambulatory Visit
Admission: RE | Admit: 2011-04-09 | Discharge: 2011-04-09 | Disposition: A | Discharge status: Home / Self Care | Payer: BC Managed Care – PPO | Source: Ambulatory Visit | Attending: Oncology | Admitting: Oncology

## 2011-04-09 DIAGNOSIS — N6489 Other specified disorders of breast: Secondary | ICD-10-CM

## 2011-04-09 MED ORDER — GADOBENATE DIMEGLUMINE 529 MG/ML IV SOLN
17.0000 mL | Freq: Once | INTRAVENOUS | Status: AC | PRN
  Administered 2011-04-09: 17 mL via INTRAVENOUS

## 2011-05-14 ENCOUNTER — Encounter: Payer: Self-pay | Admitting: Radiation Oncology

## 2011-05-14 ENCOUNTER — Ambulatory Visit
Admission: RE | Admit: 2011-05-14 | Discharge: 2011-05-14 | Disposition: A | Discharge status: Home / Self Care | Payer: BC Managed Care – PPO | Source: Ambulatory Visit | Attending: Radiation Oncology | Admitting: Radiation Oncology

## 2011-05-14 ENCOUNTER — Ambulatory Visit: Payer: BC Managed Care – PPO | Admitting: Radiation Oncology

## 2011-05-14 VITALS — BP 113/84 | HR 91 | Temp 98.0°F | Wt 197.9 lb

## 2011-05-14 DIAGNOSIS — C50911 Malignant neoplasm of unspecified site of right female breast: Secondary | ICD-10-CM | POA: Insufficient documentation

## 2011-05-14 NOTE — Progress Notes (Signed)
CC:   Angelia Mould. Derrell Lolling, M.D. Pierce Crane, M.D., F.R.C.P.C. Dineen Kid Rana Snare, M.D.  DIAGNOSIS:  Right breast cancer.  INTERVAL SINCE RADIATION THERAPY:  6 months.  NARRATIVE:  Mrs. Norwood comes today for routine followup.  She completed breast conserving therapy back in September of 2012.  Interval history is significant for the patient undergoing right shoulder surgery.  She did have repair of a rotator cuff injury.  As a consequence, the patient is having limited mobility of her right arm and shoulder at this time.  She is undergoing physical therapy for this issue.  The patient is on Femara and seems to be tolerating this well. The patient did undergo post radiation mammography which questioned possible mass within the right breast.  In light of this, the patient did undergo MRI which showed no worrisome enhancement within either breast.  The patient denies any pain within the breast areas, nipple discharge or bleeding.  PHYSICAL EXAMINATION:  The patient's temp is 98, pulse is 91, blood pressure is 113/84, weight is 197 pounds.  Examination of the neck and supraclavicular region reveals no evidence of adenopathy.  The axillary areas are free of adenopathy.  Examination of the lungs reveals them to be clear.  The heart has a regular rhythm and rate.  Examination of the left breast reveals no mass or nipple discharge.  Examination of the right breast reveals some hyperpigmentation changes and mild edema. There is some mild induration along the patient's lumpectomy scar in the upper aspect of the breast, but no dominant masses appreciated in the breast.  There is no nipple discharge or bleeding noted.  IMPRESSION/PLAN:  Clinically no evidence of disease.  In light of the patient's close followup with Dr. Derrell Lolling and Dr. Donnie Coffin, I have not scheduled Mrs. Norwood for formal followup appointment, but would be glad to see her at any time.    ______________________________ Billie Lade, Ph.D., M.D. JDK/MEDQ  D:  05/14/2011  T:  05/14/2011  Job:  [id number]

## 2011-05-14 NOTE — Progress Notes (Signed)
Here for follow up post radiation of right breast.Completed treatment on October 24, 2010. Currently taking femara. Right rotator cuff surgery April 10, 2011 by Dr. Francena Hanly.Has pain of "6" of right shoulder. Right breast  Hyperpigmented from radiation. MRI of right breast negative Feb. 25, 2013.

## 2011-05-17 ENCOUNTER — Ambulatory Visit: Payer: BC Managed Care – PPO | Admitting: Oncology

## 2011-05-17 ENCOUNTER — Other Ambulatory Visit: Payer: BC Managed Care – PPO | Admitting: Lab

## 2011-05-21 ENCOUNTER — Ambulatory Visit: Payer: BC Managed Care – PPO | Admitting: Radiation Oncology

## 2011-05-24 ENCOUNTER — Ambulatory Visit: Payer: BC Managed Care – PPO | Admitting: Oncology

## 2011-05-31 ENCOUNTER — Telehealth: Payer: Self-pay | Admitting: *Deleted

## 2011-05-31 NOTE — Telephone Encounter (Signed)
rescheduled from 05-24-2011

## 2011-06-13 ENCOUNTER — Telehealth: Payer: Self-pay | Admitting: Oncology

## 2011-06-13 NOTE — Telephone Encounter (Signed)
r/s  from may to june due to the md is out of the office. S/w the pt and she is aware of her June appts

## 2011-06-22 ENCOUNTER — Ambulatory Visit: Payer: BC Managed Care – PPO | Admitting: Oncology

## 2011-06-22 ENCOUNTER — Other Ambulatory Visit: Payer: BC Managed Care – PPO | Admitting: Lab

## 2011-08-06 ENCOUNTER — Other Ambulatory Visit: Payer: BC Managed Care – PPO | Admitting: Lab

## 2011-08-06 ENCOUNTER — Ambulatory Visit: Payer: BC Managed Care – PPO | Admitting: Oncology

## 2011-08-07 ENCOUNTER — Other Ambulatory Visit: Payer: Self-pay | Admitting: *Deleted

## 2011-08-07 ENCOUNTER — Telehealth: Payer: Self-pay | Admitting: *Deleted

## 2011-08-07 NOTE — Telephone Encounter (Signed)
patient confirmed over the phone the new date and time 09-25-2011 starting at 3:30pm

## 2011-09-25 ENCOUNTER — Telehealth: Payer: Self-pay | Admitting: *Deleted

## 2011-09-25 ENCOUNTER — Ambulatory Visit: Payer: BC Managed Care – PPO | Admitting: Family

## 2011-09-25 ENCOUNTER — Other Ambulatory Visit: Payer: BC Managed Care – PPO | Admitting: Lab

## 2011-09-25 NOTE — Telephone Encounter (Signed)
Patient confirmed over the phone the new date and time for missed appointment new appointment 11-29-2011 arrival time 2:45pm

## 2011-09-25 NOTE — Progress Notes (Signed)
Did not show for appt.

## 2011-10-16 ENCOUNTER — Encounter: Payer: Self-pay | Admitting: Oncology

## 2011-10-16 NOTE — Progress Notes (Signed)
Spoke w/ pt regarding her med Femara.  [place] (Dr. Renelda Loma nurse) informed me that pt can get it thru our pharmacy for $9.  I called the pt making her aware of that and also informed her that I will be sending a Medicaid app as well as our financial app to her in the mail since she no longer has ins thru her job because of her resignation.

## 2011-10-19 ENCOUNTER — Other Ambulatory Visit: Payer: Self-pay | Admitting: Emergency Medicine

## 2011-10-19 DIAGNOSIS — C50919 Malignant neoplasm of unspecified site of unspecified female breast: Secondary | ICD-10-CM

## 2011-10-19 MED ORDER — LETROZOLE 2.5 MG PO TABS: 2.5000 mg | ORAL_TABLET | Freq: Every day | ORAL | Status: DC

## 2011-10-29 ENCOUNTER — Encounter: Payer: Self-pay | Admitting: Oncology

## 2011-10-29 NOTE — Progress Notes (Signed)
Pt was approved for 100% financial assistance effective 10/26/11 - 04/24/12.

## 2011-11-29 ENCOUNTER — Other Ambulatory Visit (HOSPITAL_BASED_OUTPATIENT_CLINIC_OR_DEPARTMENT_OTHER): Payer: BC Managed Care – PPO | Admitting: Lab

## 2011-11-29 ENCOUNTER — Ambulatory Visit (HOSPITAL_BASED_OUTPATIENT_CLINIC_OR_DEPARTMENT_OTHER): Payer: BC Managed Care – PPO | Admitting: Oncology

## 2011-11-29 VITALS — BP 122/80 | HR 63 | Temp 97.8°F | Resp 20 | Wt 194.7 lb

## 2011-11-29 DIAGNOSIS — Z170 Estrogen receptor positive status [ER+]: Secondary | ICD-10-CM

## 2011-11-29 DIAGNOSIS — C50911 Malignant neoplasm of unspecified site of right female breast: Secondary | ICD-10-CM

## 2011-11-29 DIAGNOSIS — E559 Vitamin D deficiency, unspecified: Secondary | ICD-10-CM

## 2011-11-29 DIAGNOSIS — C50919 Malignant neoplasm of unspecified site of unspecified female breast: Secondary | ICD-10-CM

## 2011-11-29 LAB — CBC WITH DIFFERENTIAL/PLATELET
BASO%: 0.5 % (ref 0.0–2.0)
Basophils Absolute: 0 10*3/uL (ref 0.0–0.1)
EOS%: 3.3 % (ref 0.0–7.0)
HGB: 11.6 g/dL (ref 11.6–15.9)
MCH: 23.7 pg — ABNORMAL LOW (ref 25.1–34.0)
MONO%: 8.8 % (ref 0.0–14.0)
RBC: 4.89 10*6/uL (ref 3.70–5.45)
RDW: 15.2 % — ABNORMAL HIGH (ref 11.2–14.5)
lymph#: 1 10*3/uL (ref 0.9–3.3)

## 2011-11-29 LAB — COMPREHENSIVE METABOLIC PANEL (CC13)
ALT: 19 U/L (ref 0–55)
AST: 18 U/L (ref 5–34)
Albumin: 3.7 g/dL (ref 3.5–5.0)
Alkaline Phosphatase: 62 U/L (ref 40–150)
BUN: 8 mg/dL (ref 7.0–26.0)
Calcium: 9.8 mg/dL (ref 8.4–10.4)
Chloride: 106 mEq/L (ref 98–107)
Potassium: 4.1 mEq/L (ref 3.5–5.1)
Sodium: 139 mEq/L (ref 136–145)
Total Protein: 7.2 g/dL (ref 6.4–8.3)

## 2011-11-29 MED ORDER — ZOLPIDEM TARTRATE 5 MG PO TABS: 5.0000 mg | ORAL_TABLET | Freq: Every evening | ORAL | Status: DC | PRN

## 2011-11-29 NOTE — Progress Notes (Signed)
Hematology and Oncology Follow Up Visit  [name] [id number] [birth year] 56 y.o. 11/29/2011 4:23 PM   DIAGNOSIS:   Locally advanced ER/PR positive breast cancer status post neoadjuvant Femara therapy started in November 2011, status post lumpectomy June 2012 with residual T1 C. N0 disease status post radiation therapy completed 10/19/2010, on ongoing Femara therapy.  PAST THERAPY:  As above  Interim History:  She is been in relatively well unfortunately her daughter had a psychotic episode number of months ago and is just now getting stabilized on medication. Her 78 year old mother has been diagnosed with stage IV lung cancer and is undergoing radiation therapy and will be getting hospice care. She herself is doing well had a recent mammogram feels pretty well on Femara.  Medications: I have reviewed the patient's current medications.  Allergies: No Known Allergies  Past Medical History, Surgical history, Social history, and Family History were reviewed and updated.  Review of Systems: Constitutional:  Negative for fever, chills, night sweats, anorexia, weight loss, pain. Cardiovascular: no chest pain or dyspnea on exertion Respiratory: no cough, shortness of breath, or wheezing Neurological: no TIA or stroke symptoms Dermatological: negative ENT: negative Skin Gastrointestinal: negative Genito-Urinary: negative Hematological and Lymphatic: negative Breast: negative Musculoskeletal: negative Remaining ROS negative., Poor sleep  Physical Exam:  Blood pressure 122/80, pulse 63, temperature 97.8 F (36.6 C), temperature source Oral, resp. rate 20, weight 194 lb 11.2 oz (88.315 kg).  ECOG: 0  HEENT:  Sclerae anicteric, conjunctivae pink.  Oropharynx clear.  No mucositis or candidiasis.  Nodes:  No cervical, supraclavicular, or axillary lymphadenopathy palpated.  Breast Exam:  Right breast is benign.  No masses, discharge, skin change, or nipple inversion.  Left breast  is benign.  No masses, discharge, skin change, or nipple inversion..  Lungs:  Clear to auscultation bilaterally.  No crackles, rhonchi, or wheezes.  Heart:  Regular rate and rhythm.  Abdomen:  Soft, nontender.  Positive bowel sounds.  No organomegaly or masses palpated.  Musculoskeletal:  No focal spinal tenderness to palpation.  Extremities:  Benign.  No peripheral edema or cyanosis.  Skin:  Benign.  Neuro:  Nonfocal.    Lab Results: Lab Results  Component Value Date   WBC 4.2 11/29/2011   HGB 11.6 11/29/2011   HCT 37.0 11/29/2011   MCV 75.7* 11/29/2011   PLT 187 11/29/2011     Chemistry      Component Value Date/Time   NA 139 11/29/2011 1458   NA 140 11/23/2010 1335   NA 140 11/23/2010 1335   NA 140 11/23/2010 1335   K 4.1 11/29/2011 1458   K 4.0 11/23/2010 1335   K 4.0 11/23/2010 1335   K 4.0 11/23/2010 1335   CL 106 11/29/2011 1458   CL 105 11/23/2010 1335   CL 105 11/23/2010 1335   CL 105 11/23/2010 1335   CO2 26 11/29/2011 1458   CO2 25 11/23/2010 1335   CO2 25 11/23/2010 1335   CO2 25 11/23/2010 1335   BUN 8.0 11/29/2011 1458   BUN 14 11/23/2010 1335   BUN 14 11/23/2010 1335   BUN 14 11/23/2010 1335   CREATININE 0.8 11/29/2011 1458   CREATININE 0.84 11/23/2010 1335   CREATININE 0.84 11/23/2010 1335   CREATININE 0.84 11/23/2010 1335      Component Value Date/Time   CALCIUM 9.8 11/29/2011 1458   CALCIUM 9.3 11/23/2010 1335   CALCIUM 9.3 11/23/2010 1335   CALCIUM 9.3 11/23/2010 1335   ALKPHOS 62 11/29/2011 1458  ALKPHOS 60 11/23/2010 1335   ALKPHOS 60 11/23/2010 1335   ALKPHOS 60 11/23/2010 1335   AST 18 11/29/2011 1458   AST 21 11/23/2010 1335   AST 21 11/23/2010 1335   AST 21 11/23/2010 1335   ALT 19 11/29/2011 1458   ALT 22 11/23/2010 1335   ALT 22 11/23/2010 1335   ALT 22 11/23/2010 1335   BILITOT 0.30 11/29/2011 1458   BILITOT 0.2* 11/23/2010 1335   BILITOT 0.2* 11/23/2010 1335   BILITOT 0.2* 11/23/2010 1335       Radiological Studies:  No  results found.   IMPRESSIONS AND PLAN: A 56 y.o. female with   Locally advanced ER/PR positive breast cancer. She had a recent mammogram in feb which  was negative. She feels otherwise okay. I will plan to see her in 6 months time for followup. We will schedule her followup mammogram. I've given her prescription for Ambien at because of her poor sleeping. We'll continue the Femara but is having difficulty taking vitamin D.  Spent more than half the time coordinating care, as well as discussion of BMI and its implications.      Ojas Coone 10/17/20134:23 PM Cell [phone]

## 2011-12-04 ENCOUNTER — Other Ambulatory Visit: Payer: Self-pay | Admitting: *Deleted

## 2012-02-17 IMAGING — CT CT CHEST W/ CM
1 of 3 series · 15 of 30 positions shown, 19 images · IV contrast (agent unspecified)
Comparison: Today's PET, dictated separately.  The MR breast of
11/29/2009.  Nausea,

CT CHEST

CLINICAL DATA: New diagnosis of right-sided breast cancer.  No
treatments.  No complaints.

CT CHEST, ABDOMEN AND PELVIS WITH CONTRAST
TECHNIQUE: Contiguous axial images of the chest abdomen and pelvis
were obtained after IV contrast administration.
Contrast: 125 ml [id number]-7AA

[Series 2: cap with st · axial · 0.74mm/px · z∈[-546,-46]mm · 15 of 116 slices shown, 19 images]
[im 8/116  mediastinal]
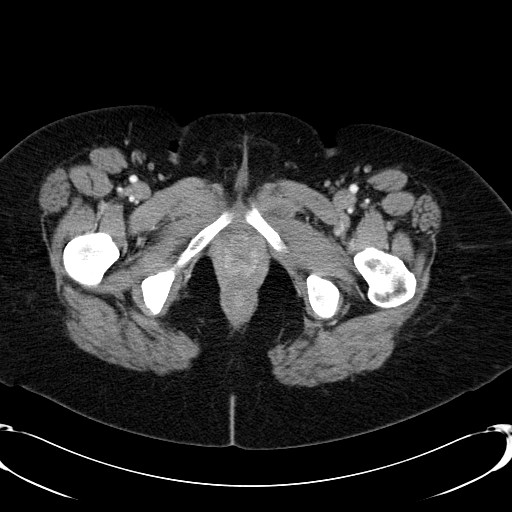
[im 8/116  lung]
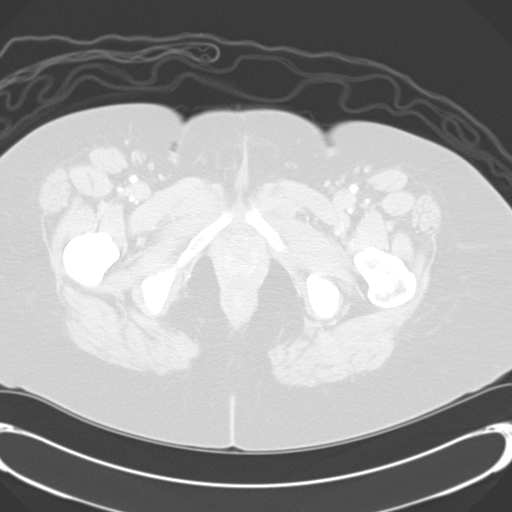
[im 15/116  lung]
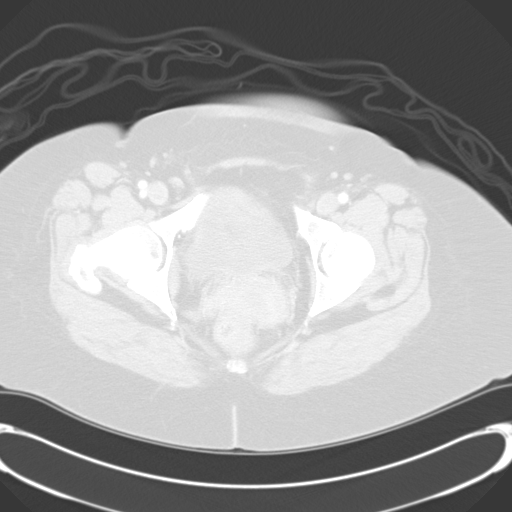
[im 22/116  lung]
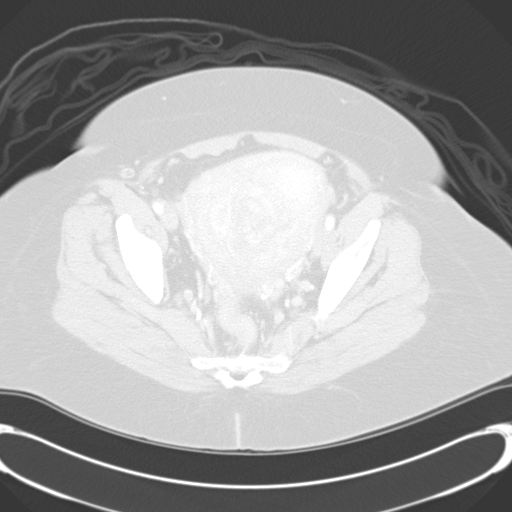
[im 29/116  lung]
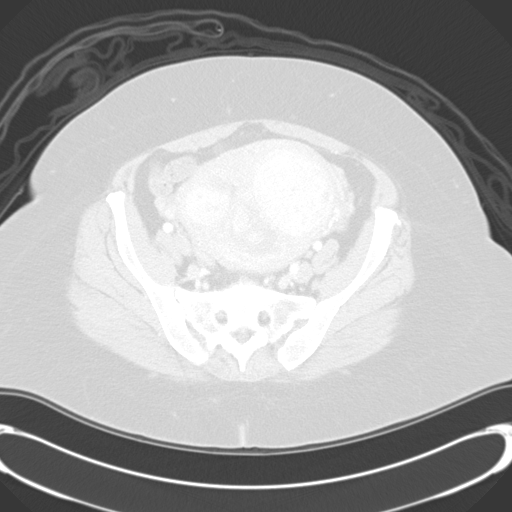
[im 36/116  mediastinal]
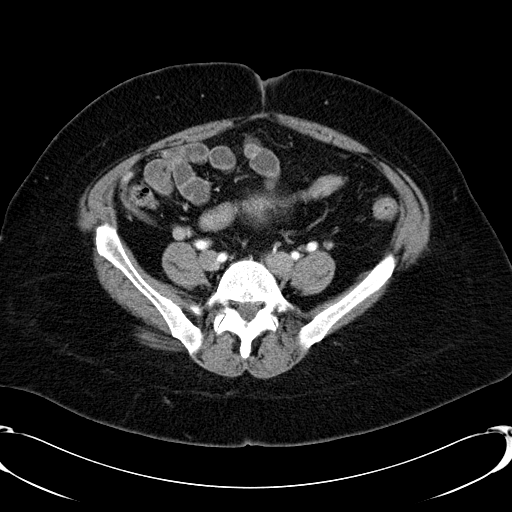
[im 36/116  lung]
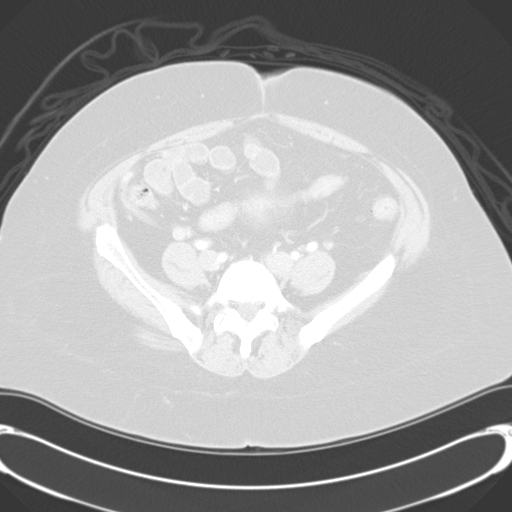
[im 44/116  lung]
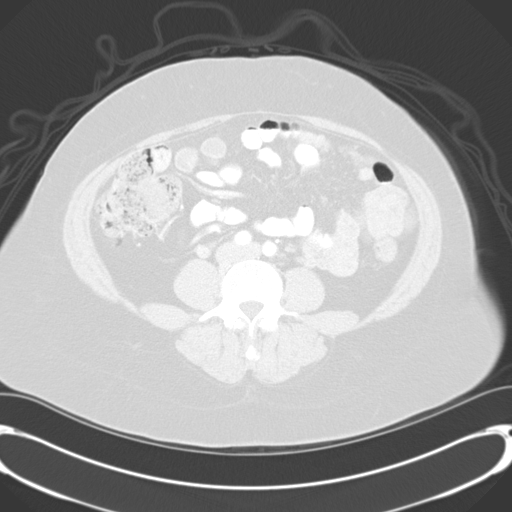
[im 51/116  lung]
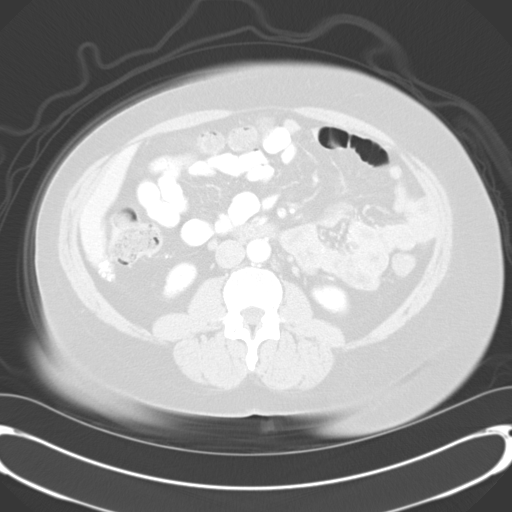
[im 58/116  lung]
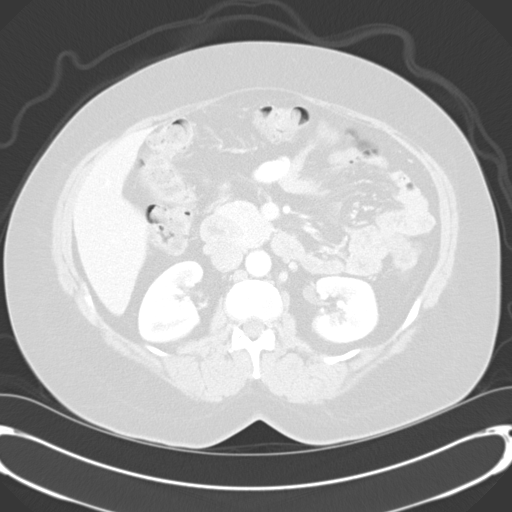
[im 65/116  mediastinal]
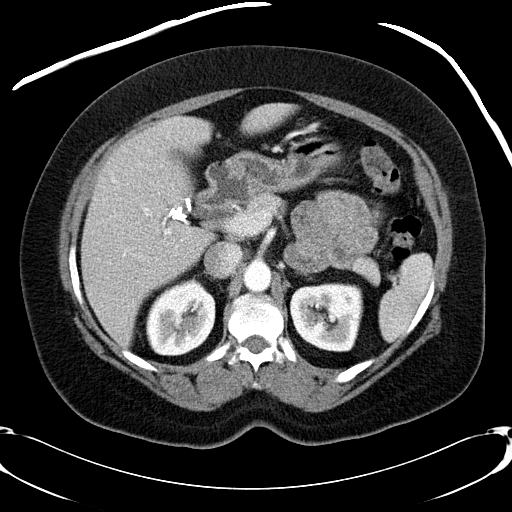
[im 65/116  lung]
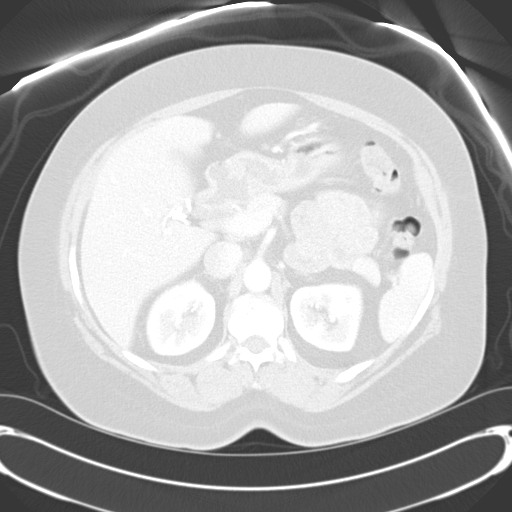
[im 72/116  lung]
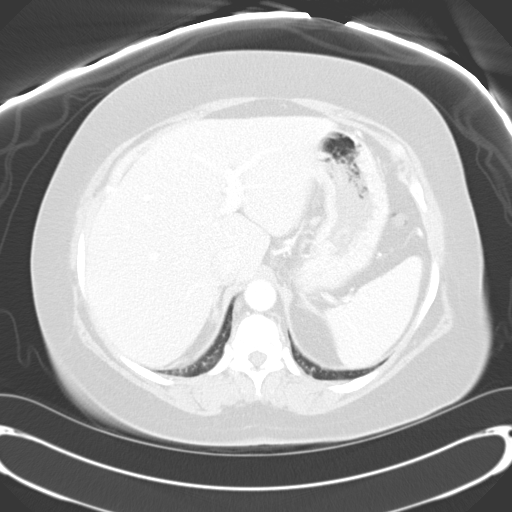
[im 80/116  lung]
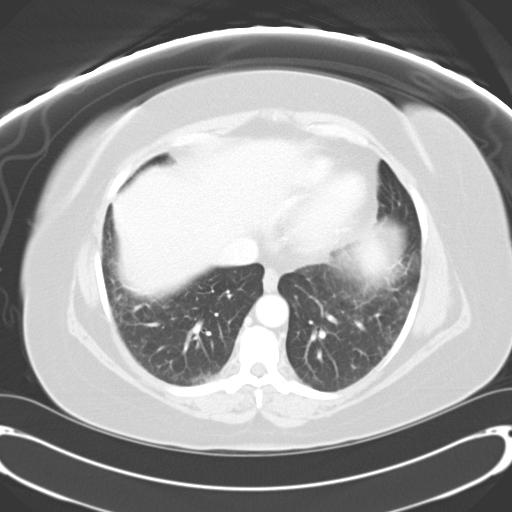
[im 87/116  lung]
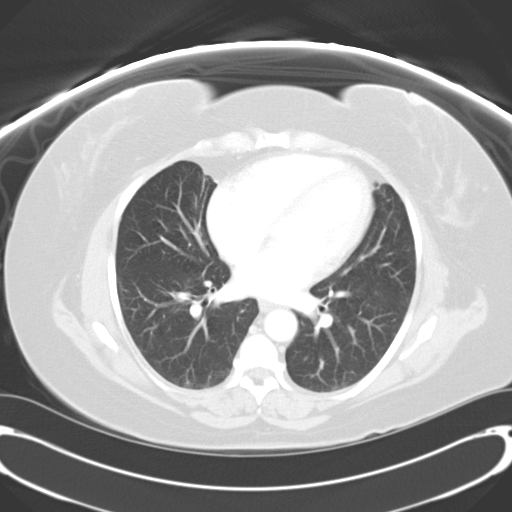
[im 94/116  mediastinal]
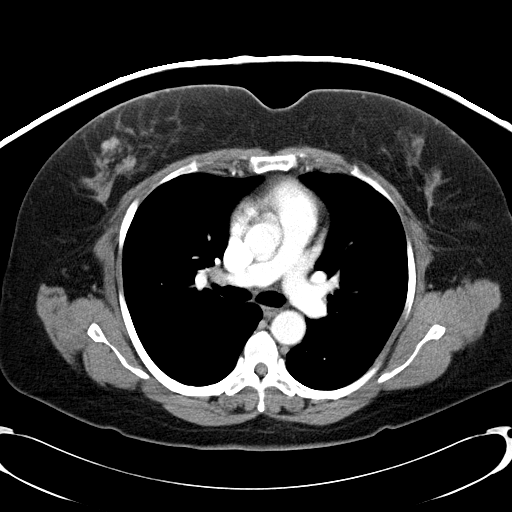
[im 94/116  lung]
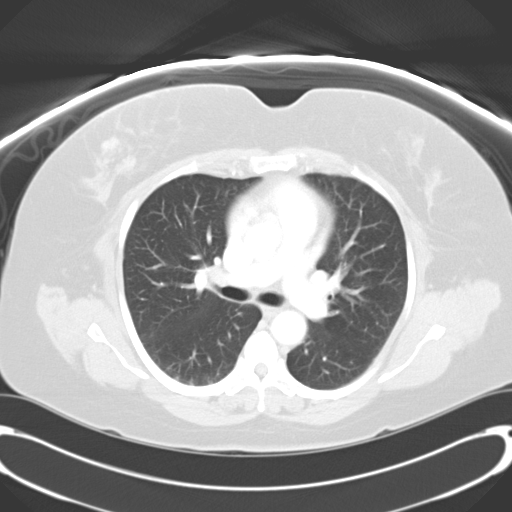
[im 101/116  lung]
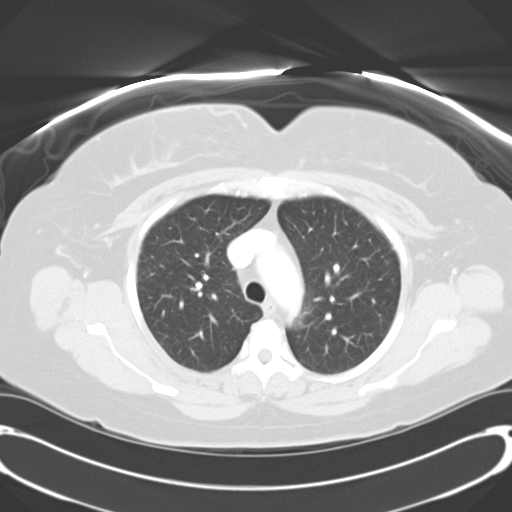
[im 108/116  lung]
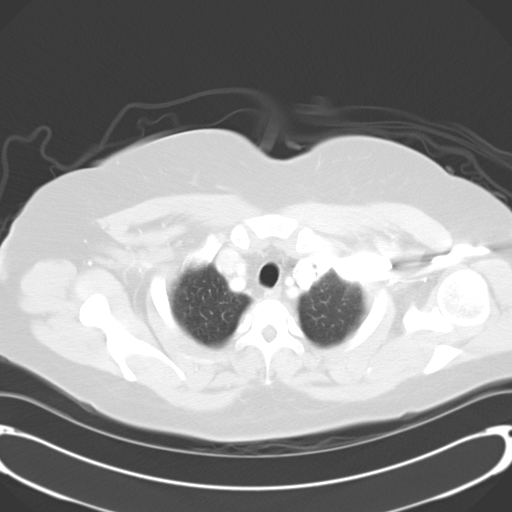

[15 of 30 positions shown; findings below may reference images not displayed]

FINDINGS: Lung windows demonstrate mild nonspecific interstitial
thickening at the lung bases.

A perifissural left upper lobe nodule on image 28 is likely a
subpleural lymph node.  6 mm.

Soft tissue windows demonstrate the right breast primary which
measures 2.5 x 2.3 cm on image 20.  No axillary adenopathy. Normal
heart size without pericardial or pleural effusion.  No mediastinal
or hilar adenopathy.
IMPRESSION: 1.  Right breast primary.  No evidence of nodal metastasis.
2.  Probable subpleural lymph node left upper lobe. Recommend
attention on follow-up.
3.  Nonspecific mild lower lobe predominant pulmonary interstitial
thickening.

CT ABDOMEN AND PELVIS
FINDINGS: Focal fat adjacent the falciform ligament.  Normal
spleen, stomach, pancreas. Cholecystectomy without biliary ductal
dilatation.  There are foci of increased density along the tip of
the liver, including images 66 - 68.  These have a morphology
suspicious for gallstones.

Normal adrenal glands and kidneys. No retroperitoneal or
retrocrural adenopathy.

Normal colon, appendix, and terminal ileum.  Normal small bowel
without abdominal ascites.

  No pelvic adenopathy.  Normal urinary bladder.  Uterine
enlargement with numerous uterine masses.  The largest is within
the left-sided uterine fundus and measures 6.0 x 6.5 cm.  No
adnexal mass or significant free pelvic fluid. No acute osseous
abnormality.
IMPRESSION: 1. No acute process or evidence of metastatic disease in the
abdomen or pelvis.
2.  Cholecystectomy.  Radiopaque foci about the inferior aspect of
the gallbladder could represent dropped gallstones.
3.  Fibroid uterus.

## 2012-02-17 IMAGING — PT NM PET TUM IMG INITIAL (PI) SKULL BASE T - THIGH
1 of 6 series · 1 of 25 positions shown · non-contrast
Comparison: Today's diagnostic CTs, dictated separately.  The
breast MR 11/29/2009.

CLINICAL DATA: Initial treatment strategy for right-sided breast
cancer.

NUCLEAR MEDICINE PET CT SKULL BASE TO THIGH
TECHNIQUE: 17.7 mCi F-18 FDG was injected intravenously via the
left forearm.  Full-ring PET imaging was performed from the skull
base through the mid-thighs 62  minutes after injection.  CT data
was obtained and used for attenuation correction and anatomic
localization only.  (This was not acquired as a diagnostic CT
examination.)
Fasting Blood Glucose:  101

[Series 2: ct images · axial · 3.8mm · 0.98mm/px · 1 of 223 slices shown]
[im 223/223  brain]
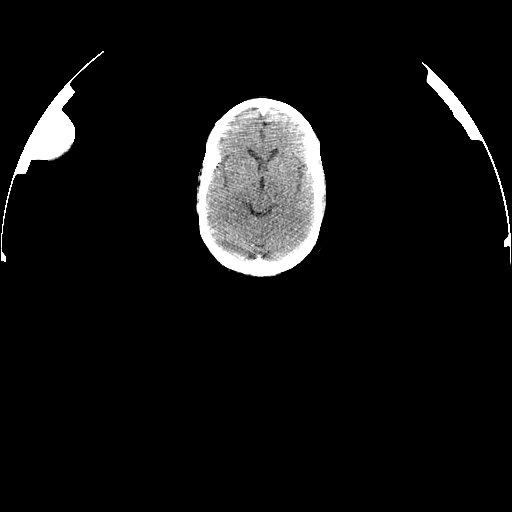

[1 of 25 positions shown; findings below may reference images not displayed]

FINDINGS: PET images demonstrate no abnormal activity within the
neck.  The right breast primary is identified.  This measures a
S.U.V. max of 4.0, including on image 72.  No abnormal nodal
activity within the chest.  No abnormal activity within the abdomen
or pelvis.

CT images performed for attenuation correction demonstrate no
significant findings in the neck.  Chest, abdomen, and pelvic
findings will be deferred to today's diagnostic CTs, dictated
separately.
IMPRESSION: Right breast primary without evidence of hypermetabolic nodal or
extrathoracic disease.

## 2012-04-29 ENCOUNTER — Encounter: Payer: Self-pay | Admitting: Oncology

## 2012-04-29 NOTE — Progress Notes (Signed)
Spoke to pt today.  She informed me that her financial assistance has expired.  I will mail her an EPP application today.  I also inquired about her Medicaid application.  She said she was denied but she has a disability hearing tomorrow.  If approved she may be able to get Medicaid as well.  I asked her to keep me posted.

## 2012-05-07 ENCOUNTER — Telehealth: Payer: Self-pay | Admitting: *Deleted

## 2012-05-07 ENCOUNTER — Encounter: Payer: Self-pay | Admitting: Oncology

## 2012-05-07 NOTE — Telephone Encounter (Signed)
Pt called about her appt and I confirmed 05/19/12 appt w/ pt.  Mailed letter & calendar to pt.

## 2012-05-15 ENCOUNTER — Other Ambulatory Visit: Payer: Self-pay | Admitting: *Deleted

## 2012-05-15 ENCOUNTER — Ambulatory Visit: Payer: BC Managed Care – PPO | Admitting: Radiation Oncology

## 2012-05-15 ENCOUNTER — Encounter: Payer: Self-pay | Admitting: Oncology

## 2012-05-15 DIAGNOSIS — C50919 Malignant neoplasm of unspecified site of unspecified female breast: Secondary | ICD-10-CM

## 2012-05-15 MED ORDER — LETROZOLE 2.5 MG PO TABS: 2.5000 mg | ORAL_TABLET | Freq: Every day | ORAL | Status: DC

## 2012-05-15 NOTE — Progress Notes (Signed)
Pt is approved for 100% financial assistance effective 05/15/12 - 11/14/12.  I will give approval letter and green card to pt at her visit on 05/19/12.

## 2012-05-19 ENCOUNTER — Telehealth: Payer: Self-pay | Admitting: *Deleted

## 2012-05-19 ENCOUNTER — Ambulatory Visit (HOSPITAL_BASED_OUTPATIENT_CLINIC_OR_DEPARTMENT_OTHER): Payer: Self-pay | Admitting: Lab

## 2012-05-19 ENCOUNTER — Encounter: Payer: Self-pay | Admitting: Gynecologic Oncology

## 2012-05-19 ENCOUNTER — Ambulatory Visit (HOSPITAL_BASED_OUTPATIENT_CLINIC_OR_DEPARTMENT_OTHER): Payer: Self-pay | Admitting: Gynecologic Oncology

## 2012-05-19 VITALS — BP 120/80 | HR 69 | Temp 97.9°F | Resp 20 | Ht 60.0 in | Wt 197.8 lb

## 2012-05-19 DIAGNOSIS — Z853 Personal history of malignant neoplasm of breast: Secondary | ICD-10-CM

## 2012-05-19 DIAGNOSIS — C50919 Malignant neoplasm of unspecified site of unspecified female breast: Secondary | ICD-10-CM

## 2012-05-19 DIAGNOSIS — Z170 Estrogen receptor positive status [ER+]: Secondary | ICD-10-CM

## 2012-05-19 LAB — CBC WITH DIFFERENTIAL/PLATELET
Basophils Absolute: 0 10*3/uL (ref 0.0–0.1)
Eosinophils Absolute: 0.1 10*3/uL (ref 0.0–0.5)
HGB: 11.6 g/dL (ref 11.6–15.9)
LYMPH%: 26.3 % (ref 14.0–49.7)
MCV: 74 fL — ABNORMAL LOW (ref 79.5–101.0)
MONO#: 0.5 10*3/uL (ref 0.1–0.9)
MONO%: 7.4 % (ref 0.0–14.0)
NEUT#: 4 10*3/uL (ref 1.5–6.5)
Platelets: 211 10*3/uL (ref 145–400)
RBC: 5.11 10*6/uL (ref 3.70–5.45)
WBC: 6.3 10*3/uL (ref 3.9–10.3)

## 2012-05-19 LAB — COMPREHENSIVE METABOLIC PANEL
Albumin: 3.4 g/dL — ABNORMAL LOW (ref 3.5–5.2)
BUN: 12 mg/dL (ref 6–23)
CO2: 29 mEq/L (ref 19–32)
Glucose, Bld: 96 mg/dL (ref 70–99)
Potassium: 3.9 mEq/L (ref 3.5–5.3)
Sodium: 139 mEq/L (ref 135–145)
Total Bilirubin: 0.2 mg/dL — ABNORMAL LOW (ref 0.3–1.2)
Total Protein: 7.5 g/dL (ref 6.0–8.3)

## 2012-05-19 NOTE — Telephone Encounter (Signed)
appts made and printed 

## 2012-05-19 NOTE — Progress Notes (Signed)
ID: RETTA PITCHER   DOB: [birth year]  MR#: [id number]  WUJ#:[id number]  PCP: No PCP at this time GYN:  Dr. Candice Camp  SU:  Dr. Derrell Lolling   HISTORY OF PRESENT ILLNESS:  [name] is a 57 year old [place] woman, who underwent imaging studies at the breast center revealing an irregular mass within the right breast as well as a smaller lesion noted more anterior at approximately the 12 o'clock position.  Ultrasound through the axilla did not reveal any suspicious lymphadenopathy.  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  On 12/13/09, the patient underwent biopsy under MR guidance of the second lesion revealing invasive ductal carcinoma.  The patient did undergo initial PET scan as part of her staging workup showing no evidence of spread outside the breast or suspicious lymphadenopathy.  Given the location of the patient's lesions and the fact she had 2 separate areas, she was felt to be a good candidate for neoadjuvant hormonal therapy.  The patient was treated with Femara starting in November 2011.  The patient did have a good response to her hormonal therapy.  On July 18, 2010, the patient was taken the operating room by Dr. Derrell Lolling for a right partial mastectomy with needle localization x2.  She also underwent right axillary sentinel lymph node mapping and biopsy.  Upon pathologic review, the patient was found have 2 separate areas within the right breast specimen.  Both of these areas were invasive ductal carcinoma with one lesion measuring 1.6 cm and the second measuring 1.2 cm.  In addition, there was in situ carcinoma recovered.  The in situ the lesion was within 1 mm of the inferior margin.  Intraoperatively, the patient did proceed to undergo re-excision with clear margins.  The patient had 2 sentinel lymph nodes, which showed no evidence of metastatic disease.  In addition, the patient  underwent regional lymph node resection of the right axilla, which revealed 5 additional benign lymph nodes.  She underwent radiation therapy under the care of Dr. Roselind Messier from 08/30/10 to 10/24/10.  She had continued taking Femara since November 2011 until last year, 2013, when she stopped the medication due to financial issues.    INTERVAL HISTORY:  She presents today for continued follow up.  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  She reports continuing to have hot flashes today.  She reports intermittent right knee pain after receiving three injections and states that she will have to have a knee replacement in the near future.  She states that she has not had her mammogram this year due to lack of insurance and financial issues related to unemployment.  She recently applied for disability and she is waiting to hear back about the results of her disability hearing she had in March 2014.  She wants to know what diet pills are safe for her to take due to her recent weight gain.   REVIEW OF SYSTEMS: Constitutional: Feels well.  Cardiovascular: No chest pain, shortness of breath, or edema.  Pulmonary: No cough or wheeze.  Gastrointestinal: No nausea, vomiting, or diarrhea. No bright red blood per rectum or change in bowel movement.  Genitourinary: No frequency, urgency, or dysuria. No vaginal bleeding or discharge.  Musculoskeletal: Intermittent right shoulder soreness post  rotator cuff repair and right knee pain. Neurologic: No weakness, numbness, or change in gait.  Psychology: No depression, anxiety, or insomnia.  PAST MEDICAL HISTORY: Past Medical History  Diagnosis Date  . Anemia   . Breast cancer   . S/P rotator cuff repair     PAST SURGICAL HISTORY: Past Surgical History  Procedure Laterality Date  . Cholecystectomy    . Abdominal  hysterectomy    . Breast surgery      partial mastectomy  . Rotator cuff repair      FAMILY HISTORY Family History  Problem Relation Age of Onset  . Hypertension Mother   . Heart disease Father     GYNECOLOGIC HISTORY:  G4, P4.  She has three daughters and one son.  She has multiple grandchildren.  Menarche age 73.  S/P TAH-BSO due to heavy periods.  SOCIAL HISTORY:  The patient is married and has 4 children.  The patient did work as a Lawyer at [employer], however, has been unemployed due to restrictions placed after her recent rotator cuff surgery.  No history of alcohol intake or tobacco use.    ADVANCED DIRECTIVES:  None  HEALTH MAINTENANCE: History  Substance Use Topics  . Smoking status: Never Smoker   . Smokeless tobacco: Not on file  . Alcohol Use: No     Colonoscopy:  Not had  PAP:  S/P Hyst  Bone density:  Not had  Lipid panel:  Not had  No Known Allergies  Current Outpatient Prescriptions  Medication Sig Dispense Refill  . hydrOXYzine (ATARAX) 25 MG tablet Take 25 mg by mouth 3 (three) times daily as needed.        Marland Kitchen letrozole (FEMARA) 2.5 MG tablet Take 1 tablet (2.5 mg total) by mouth daily.  90 tablet  3  . naproxen (NAPROSYN) 500 MG tablet Take 500 mg by mouth 2 (two) times daily with a meal.      . zolpidem (AMBIEN) 5 MG tablet Take 1 tablet (5 mg total) by mouth at bedtime as needed for sleep.  30 tablet  0   No current facility-administered medications for this visit.    OBJECTIVE: Filed Vitals:   05/19/12 1508  BP: 120/80  Pulse: 69  Temp: 97.9 F (36.6 C)  Resp: 20     Body mass index is 38.63 kg/(m^2).    ECOG FS:  Asymptomatic  General: Well developed, well nourished female in no acute distress. Alert and oriented x 3.  Head/ Neck: Oropharynx clear.  Sclerae anicteric.  Supple without any enlargements.  Lymph node survey: No cervical, supraclavicular, or axillary adenopathy.  Cardiovascular: Regular rate and rhythm. S1 and S2  normal.  Lungs: Clear to auscultation bilaterally. No wheezes/crackles/rhonchi noted.  Skin: No rashes or lesions present. Back: No CVA tenderness.  Abdomen: Abdomen soft, non-tender and obese. Active bowel sounds in all quadrants. No evidence of a fluid wave or abdominal masses.  Breasts: Inspection negative with no nodularity, masses, erythema, or discharge noted bilaterally. Extremities: No bilateral cyanosis, edema, or clubbing.    LAB RESULTS: Lab Results  Component Value Date   WBC 6.3 05/19/2012   NEUTROABS 4.0 05/19/2012   HGB 11.6 05/19/2012   HCT 37.8 05/19/2012   MCV 74.0* 05/19/2012   PLT 211 05/19/2012      Chemistry      Component Value Date/Time   NA 139 05/19/2012 1609   NA 139 11/29/2011 1458   K 3.9 05/19/2012 1609   K 4.1  11/29/2011 1458   CL 103 05/19/2012 1609   CL 106 11/29/2011 1458   CO2 29 05/19/2012 1609   CO2 26 11/29/2011 1458   BUN 12 05/19/2012 1609   BUN 8.0 11/29/2011 1458   CREATININE 0.76 05/19/2012 1609   CREATININE 0.8 11/29/2011 1458      Component Value Date/Time   CALCIUM 9.5 05/19/2012 1609   CALCIUM 9.8 11/29/2011 1458   ALKPHOS 67 05/19/2012 1609   ALKPHOS 62 11/29/2011 1458   AST 15 05/19/2012 1609   AST 18 11/29/2011 1458   ALT 14 05/19/2012 1609   ALT 19 11/29/2011 1458   BILITOT 0.2* 05/19/2012 1609   BILITOT 0.30 11/29/2011 1458       Lab Results  Component Value Date   LABCA2 38 11/23/2010    No components found with this basename: LABCA125    No results found for this basename: INR,  in the last 168 hours  Urinalysis    Component Value Date/Time   COLORURINE YELLOW 07/17/2010 1530   APPEARANCEUR CLEAR 07/17/2010 1530   LABSPEC 1.024 07/17/2010 1530   PHURINE 5.0 07/17/2010 1530   GLUCOSEU NEGATIVE 07/17/2010 1530   HGBUR NEGATIVE 07/17/2010 1530   BILIRUBINUR NEGATIVE 07/17/2010 1530   KETONESUR NEGATIVE 07/17/2010 1530   PROTEINUR NEGATIVE 07/17/2010 1530   UROBILINOGEN 0.2 07/17/2010 1530   NITRITE NEGATIVE 07/17/2010 1530   LEUKOCYTESUR SMALL*  07/17/2010 1530    STUDIES: No results found.  ASSESSMENT: 57 y.o. [place] woman: #1 S/P biopsy of the right breast on 11/24/09 resulting IDC plus DCIS, ER 99%, PR 97%, Ki67 9% and MRI on 11/29/09 resulting 4 cm mass.  #2  Neoadjuvant Femara started in November 2011.  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  #4  She underwent radiation therapy from 08/30/10 to 10/24/10 under the care of Dr. Roselind Messier.  #5  She has taken Femara since November 2011 until last year when she stopped the medication due to unemployment and financial issues.   PLAN:  She is to be contacted by Stoney Bang with [facility] about scheduling her mammogram.  We will call her with her lab results.  She is to follow up with Dr. Darnelle Catalan in April 2015 or sooner if issues or concerns arise.  She is advised to please call for any questions or concerns.  She has also met with the financial counselors prior to her visit today.   The patient was reviewed with Dr. Darnelle Catalan, who spoke with the patient about future recommendations and plans.    CROSS, MELISSA DEAL    05/20/2012

## 2012-05-19 NOTE — Patient Instructions (Addendum)
You will hear Stoney Bang with [facility] about scheduling your mammogram.  We will call you with your lab results.  Plan to follow up with Dr. Darnelle Catalan in April 2015.  Please call for any questions or concerns.

## 2012-05-21 ENCOUNTER — Telehealth: Payer: Self-pay | Admitting: Gynecologic Oncology

## 2012-05-21 NOTE — Telephone Encounter (Signed)
Attempted to call the patient to discuss lab results but she is unavailable at this time.  Message left with a family member to tell her that I called and that I will retry at a later time.

## 2012-05-22 ENCOUNTER — Telehealth: Payer: Self-pay | Admitting: Gynecologic Oncology

## 2012-05-22 NOTE — Telephone Encounter (Signed)
Patient informed of lab results.  No concerns voiced.  Instructed to call for any questions or concerns.

## 2012-06-02 ENCOUNTER — Ambulatory Visit
Admission: RE | Admit: 2012-06-02 | Discharge: 2012-06-02 | Disposition: A | Discharge status: Home / Self Care | Payer: Self-pay | Source: Ambulatory Visit | Attending: Radiation Oncology | Admitting: Radiation Oncology

## 2012-06-02 ENCOUNTER — Encounter: Payer: Self-pay | Admitting: Radiation Oncology

## 2012-06-02 VITALS — BP 127/87 | HR 73 | Temp 97.5°F | Resp 20 | Wt 198.7 lb

## 2012-06-02 DIAGNOSIS — C50911 Malignant neoplasm of unspecified site of right female breast: Secondary | ICD-10-CM

## 2012-06-02 NOTE — Progress Notes (Signed)
  Radiation Oncology         [phone] ________________________________  Name: [name] MRN: [id number]  Date: 06/02/2012  DOB: [birth year]  Follow-Up Visit Note  CC: Pierce Crane, MD  Ernestene Mention, MD  Diagnosis:   Right breast cancer  Interval Since Last Radiation:  1 and1/2 years   Narrative:  The patient returns today for routine follow-up.  She is doing well at this time. She denies any pain within the right breast area nipple discharge or bleeding.  She denies any problems with swelling in her right arm or hand. She has stopped taking Femara due to financial issues. She is scheduled for a mammogram later this month.                              ALLERGIES:  has No Known Allergies.  Meds: Current Outpatient Prescriptions  Medication Sig Dispense Refill  . hydrOXYzine (ATARAX) 25 MG tablet Take 25 mg by mouth 3 (three) times daily as needed.        Marland Kitchen letrozole (FEMARA) 2.5 MG tablet Take 1 tablet (2.5 mg total) by mouth daily.  90 tablet  3  . zolpidem (AMBIEN) 5 MG tablet Take 1 tablet (5 mg total) by mouth at bedtime as needed for sleep.  30 tablet  0  . naproxen (NAPROSYN) 500 MG tablet Take 500 mg by mouth 2 (two) times daily with a meal.       No current facility-administered medications for this encounter.    Physical Findings: The patient is in no acute distress. Patient is alert and oriented.  weight is 198 lb 11.2 oz (90.13 kg). Her temperature is 97.5 F (36.4 C). Her blood pressure is 127/87 and her pulse is 73. Her respiration is 20. Marland Kitchen  No palpable supraclavicular or axillary adenopathy. The lungs are clear to auscultation. The heart has a regular rhythm and rate. Examination of the left breast reveals no mass or nipple discharge. Examination right breast reveals some hyperpigmentation changes. There is no dominant mass appreciated in the breast nipple discharge or bleeding.  Lab Findings: Lab Results  Component Value Date   WBC 6.3 05/19/2012   HGB  11.6 05/19/2012   HCT 37.8 05/19/2012   MCV 74.0* 05/19/2012   PLT 211 05/19/2012     Radiographic Findings: No results found.  Impression:  No evidence of recurrence on clinical exam today. Mammograms as above later this month.  Plan:  When necessary followup in radiation oncology. Patient will continue close followup in medical oncology.  _____________________________________  -----------------------------------  Billie Lade, PhD, MD

## 2012-06-02 NOTE — Progress Notes (Signed)
Pt denies pain, fatigue, loss of appetite. Taking Femara. She has mammogram scheduled for 06/04/12.

## 2012-06-18 ENCOUNTER — Ambulatory Visit
Admission: RE | Admit: 2012-06-18 | Discharge: 2012-06-18 | Disposition: A | Discharge status: Home / Self Care | Payer: No Typology Code available for payment source | Source: Ambulatory Visit | Attending: Gynecologic Oncology | Admitting: Gynecologic Oncology

## 2012-06-18 DIAGNOSIS — Z853 Personal history of malignant neoplasm of breast: Secondary | ICD-10-CM

## 2013-03-05 ENCOUNTER — Telehealth: Payer: Self-pay | Admitting: Oncology

## 2013-03-09 ENCOUNTER — Ambulatory Visit (HOSPITAL_BASED_OUTPATIENT_CLINIC_OR_DEPARTMENT_OTHER): Payer: Medicaid Other

## 2013-03-09 ENCOUNTER — Telehealth: Payer: Self-pay | Admitting: Hematology and Oncology

## 2013-03-09 ENCOUNTER — Other Ambulatory Visit: Payer: Self-pay | Admitting: Hematology and Oncology

## 2013-03-09 ENCOUNTER — Ambulatory Visit (HOSPITAL_BASED_OUTPATIENT_CLINIC_OR_DEPARTMENT_OTHER): Payer: Medicaid Other | Admitting: Hematology and Oncology

## 2013-03-09 ENCOUNTER — Encounter: Payer: Self-pay | Admitting: Hematology and Oncology

## 2013-03-09 VITALS — BP 127/83 | HR 69 | Temp 98.1°F | Resp 18 | Ht 60.0 in | Wt 189.9 lb

## 2013-03-09 DIAGNOSIS — C50919 Malignant neoplasm of unspecified site of unspecified female breast: Secondary | ICD-10-CM

## 2013-03-09 DIAGNOSIS — M7989 Other specified soft tissue disorders: Secondary | ICD-10-CM

## 2013-03-09 DIAGNOSIS — N644 Mastodynia: Secondary | ICD-10-CM

## 2013-03-09 LAB — COMPREHENSIVE METABOLIC PANEL (CC13)
ALK PHOS: 58 U/L (ref 40–150)
ALT: 20 U/L (ref 0–55)
AST: 18 U/L (ref 5–34)
Albumin: 4.1 g/dL (ref 3.5–5.0)
Anion Gap: 9 mEq/L (ref 3–11)
BUN: 11.1 mg/dL (ref 7.0–26.0)
CO2: 26 mEq/L (ref 22–29)
Calcium: 10.1 mg/dL (ref 8.4–10.4)
Chloride: 106 mEq/L (ref 98–109)
Creatinine: 0.8 mg/dL (ref 0.6–1.1)
Glucose: 97 mg/dl (ref 70–140)
Potassium: 4.1 mEq/L (ref 3.5–5.1)
Sodium: 140 mEq/L (ref 136–145)
Total Bilirubin: 0.56 mg/dL (ref 0.20–1.20)
Total Protein: 8 g/dL (ref 6.4–8.3)

## 2013-03-09 LAB — CBC WITH DIFFERENTIAL/PLATELET
BASO%: 1.1 % (ref 0.0–2.0)
Basophils Absolute: 0.1 10*3/uL (ref 0.0–0.1)
EOS ABS: 0.2 10*3/uL (ref 0.0–0.5)
EOS%: 3.4 % (ref 0.0–7.0)
HCT: 39.5 % (ref 34.8–46.6)
HGB: 12.4 g/dL (ref 11.6–15.9)
LYMPH%: 23.3 % (ref 14.0–49.7)
MCH: 23.4 pg — ABNORMAL LOW (ref 25.1–34.0)
MCHC: 31.3 g/dL — ABNORMAL LOW (ref 31.5–36.0)
MCV: 74.8 fL — AB (ref 79.5–101.0)
MONO#: 0.5 10*3/uL (ref 0.1–0.9)
MONO%: 7.1 % (ref 0.0–14.0)
NEUT%: 65.1 % (ref 38.4–76.8)
NEUTROS ABS: 4.2 10*3/uL (ref 1.5–6.5)
Platelets: 216 10*3/uL (ref 145–400)
RBC: 5.28 10*6/uL (ref 3.70–5.45)
RDW: 15.4 % — AB (ref 11.2–14.5)
WBC: 6.4 10*3/uL (ref 3.9–10.3)
lymph#: 1.5 10*3/uL (ref 0.9–3.3)

## 2013-03-09 MED ORDER — LETROZOLE 2.5 MG PO TABS: 2.5000 mg | ORAL_TABLET | Freq: Every day | ORAL | Status: DC

## 2013-03-09 NOTE — Progress Notes (Signed)
ID: [name]   DOB: 17-Aug-1955  MR#: [id number]  VOJ#:[id number]  PCP: No PCP at this time GYN:  Dr. Louretta Shorten  SU:  Dr. Dalbert Batman   Diagnosis: Right breast cancer   Current therapy:  Femara started in November 2011  Chief complaint: Breast cancer followup  HISTORY OF PRESENT ILLNESS:  [name] is a 58 year old [place] woman, who underwent imaging studies at the breast center revealing an irregular mass within the right breast as well as a smaller lesion noted more anterior at approximately the 12 o'clock position.  Ultrasound through the axilla did not reveal any suspicious lymphadenopathy.  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  On 12/13/09, the patient underwent biopsy under MR guidance of the second lesion revealing invasive ductal carcinoma.  The patient did undergo initial PET scan as part of her staging workup showing no evidence of spread outside the breast or suspicious lymphadenopathy.  Given the location of the patient's lesions and the fact she had 2 separate areas, she was felt to be a good candidate for neoadjuvant hormonal therapy.  The patient was treated with Femara starting in November 2011.  The patient did have a good response to her hormonal therapy.  On July 18, 2010, the patient was taken the operating room by Dr. Dalbert Batman for a right partial mastectomy with needle localization x2.  She also underwent right axillary sentinel lymph node mapping and biopsy.  Upon pathologic review, the patient was found have 2 separate areas within the right breast specimen.  Both of these areas were invasive ductal carcinoma with one lesion measuring 1.6 cm and the second measuring 1.2 cm.  In addition, there was in situ carcinoma recovered.  The in situ the lesion was within 1 mm of the inferior margin.  Intraoperatively, the patient did proceed to undergo re-excision with clear  margins.  The patient had 2 sentinel lymph nodes, which showed no evidence of metastatic disease.  In addition, the patient underwent regional lymph node resection of the right axilla, which revealed 5 additional benign lymph nodes.  She underwent radiation therapy under the care of Dr. Sondra Come from 08/30/10 to 10/24/10.  She had continued taking Femara since November 2011 until last year, 2013, when she stopped the medication due to financial issues.    INTERVAL HISTORY:  [name] is here for followup visit today. Since the time she got her Medicaid insurance she started taking the Femara. She says that she only missed taking Femara for couple months. She does not recall having DEXA scan and  also she is not taking any calcium or vitamin D. She says that she's eating well and not losing any weight . she has good appetite. She does not complain of any shortness of breath, chest pain, palpitations. She does not complain of any constipation or  Stool, blood in the urine. Her hot flashes are getting better She does complain of right axillary swelling and tenderness and also right breast heaviness for the past 3 weeks.   REVIEW OF SYSTEMS: Constitutional: Feels well.  Cardiovascular: No chest pain, shortness of breath, or edema.  Pulmonary: No cough or wheeze.  Gastrointestinal: No nausea, vomiting, or diarrhea. No bright red blood per rectum or change in bowel movement.  Genitourinary: No frequency, urgency, or dysuria. No vaginal bleeding or discharge.  Neurologic: No weakness, numbness, or change in gait.  Psychology: No depression, anxiety, or insomnia. Extremities: Right axillary swelling and pain  PAST MEDICAL HISTORY: Past Medical History  Diagnosis Date  . Anemia   . Breast cancer   . S/P rotator cuff repair     PAST SURGICAL HISTORY: Past Surgical History  Procedure Laterality Date  . Cholecystectomy    . Abdominal hysterectomy    . Breast surgery      partial mastectomy  .  Rotator cuff repair      FAMILY HISTORY Family History  Problem Relation Age of Onset  . Hypertension Mother   . Heart disease Father     GYNECOLOGIC HISTORY:  G4, P4.  She has three daughters and one son.  She has multiple grandchildren.  Menarche age 41.  S/P TAH-BSO due to heavy periods.  SOCIAL HISTORY:  The patient is married and has 4 children.  The patient did work as a Quarry manager at [employer], however, has been unemployed due to restrictions placed after her recent rotator cuff surgery.  No history of alcohol intake or tobacco use.    ADVANCED DIRECTIVES:  None  HEALTH MAINTENANCE: History  Substance Use Topics  . Smoking status: Never Smoker   . Smokeless tobacco: Not on file  . Alcohol Use: No     Colonoscopy:  Not had  PAP:  S/P Hyst  Bone density:  Not had  Lipid panel:  Not had  No Known Allergies  Current Outpatient Prescriptions  Medication Sig Dispense Refill  . letrozole (FEMARA) 2.5 MG tablet Take 1 tablet (2.5 mg total) by mouth daily.  90 tablet  3  . hydrOXYzine (ATARAX) 25 MG tablet Take 25 mg by mouth 3 (three) times daily as needed.        . naproxen (NAPROSYN) 500 MG tablet Take 500 mg by mouth 2 (two) times daily with a meal.      . zolpidem (AMBIEN) 5 MG tablet Take 1 tablet (5 mg total) by mouth at bedtime as needed for sleep.  30 tablet  0   No current facility-administered medications for this visit.    OBJECTIVE: Filed Vitals:   03/09/13 1313  BP: 127/83  Pulse: 69  Temp: 98.1 F (36.7 C)  Resp: 18     Body mass index is 37.09 kg/(m^2).    ECOG FS:  Asymptomatic  General: Well developed, well nourished female in no acute distress. Alert and oriented x 3.  Head/ Neck: Oropharynx clear.  Sclerae anicteric.  Supple without any enlargements.  Lymph node survey: No cervical, supraclavicular, or axillary adenopathy.  Cardiovascular: Regular rate and rhythm. S1 and S2 normal.  Lungs: Clear to auscultation bilaterally. No  wheezes/crackles/rhonchi noted.  Skin: No rashes or lesions present. Back: No CVA tenderness.  Abdomen: Abdomen soft, non-tender and obese. Active bowel sounds in all quadrants. No evidence of a fluid wave or abdominal masses.  Breasts: Status post right breast lumpectomy scar noted and no masses appreciated in the right breast. Right axillary tenderness and swelling noted. Left breast no masses felt Extremities: No bilateral cyanosis, edema, or clubbing.    LAB RESULTS: Lab Results  Component Value Date   WBC 6.4 03/09/2013   NEUTROABS 4.2 03/09/2013   HGB 12.4 03/09/2013   HCT 39.5 03/09/2013   MCV 74.8* 03/09/2013   PLT 216 03/09/2013      Chemistry      Component Value Date/Time   NA 140 03/09/2013 1304   NA 139 05/19/2012 1609   K 4.1 03/09/2013  1304   K 3.9 05/19/2012 1609   CL 103 05/19/2012 1609   CL 106 11/29/2011 1458   CO2 26 03/09/2013 1304   CO2 29 05/19/2012 1609   BUN 11.1 03/09/2013 1304   BUN 12 05/19/2012 1609   CREATININE 0.8 03/09/2013 1304   CREATININE 0.76 05/19/2012 1609      Component Value Date/Time   CALCIUM 10.1 03/09/2013 1304   CALCIUM 9.5 05/19/2012 1609   ALKPHOS 58 03/09/2013 1304   ALKPHOS 67 05/19/2012 1609   AST 18 03/09/2013 1304   AST 15 05/19/2012 1609   ALT 20 03/09/2013 1304   ALT 14 05/19/2012 1609   BILITOT 0.56 03/09/2013 1304   BILITOT 0.2* 05/19/2012 1609       Lab Results  Component Value Date   LABCA2 38 11/23/2010    No components found with this basename: LABCA125    No results found for this basename: INR,  in the last 168 hours  Urinalysis    Component Value Date/Time   COLORURINE YELLOW 07/17/2010 [place] 07/17/2010 1530   LABSPEC 1.024 07/17/2010 1530   PHURINE 5.0 07/17/2010 1530   GLUCOSEU NEGATIVE 07/17/2010 [place] 07/17/2010 [place] 07/17/2010 [place] 07/17/2010 1530   PROTEINUR NEGATIVE 07/17/2010 1530   UROBILINOGEN 0.2 07/17/2010 1530   NITRITE NEGATIVE 07/17/2010 1530    LEUKOCYTESUR SMALL* 07/17/2010 1530    STUDIES: No results found.  ASSESSMENT: 58 y.o. [place] woman: #1 S/P biopsy of the right breast on 11/24/09 resulting IDC plus DCIS, ER 99%, PR 97%, Ki67 9% and MRI on 11/29/09 resulting 4 cm mass.  #2  Neoadjuvant Femara started in November 2011.  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  #4  She underwent radiation therapy from 08/30/10 to 10/24/10 under the care of Dr. Sondra Come.  #5  She has taken Femara since November 2011 until last year when she stopped the medication due to unemployment and financial issues.   #6 Annual mammogram which was performed on 06/18/2012 was negative for any malignancy  #7 she restarted taking Femara since June or July of last year  PLAN:  #1 Right axillary discomfort and swelling on the right breast discomfort: We'll schedule for the right breast and axillary sonogram and mammogram to assess for any abnormalities. If ultrasound is negative we will arrange for lymphedema treatment.  #2Burnis Medin arrange for DEXA scan since the patient is on Femara. I also instructed the patient to start taking 1200 mg of calcium  With 800 units of  vitamin D supplementation  #3 next annual mammogram in May 2015  #4 CBC differential and CMP today  #5 next follow up in 6 months with CBC and differential and CMP  #6 continue femara 2.5 mg by mouth once daily as scheduled  Ms. Norwood is in agreement with the plan of care.  She knows to call our office if any concerns or issues  Total time spent: 30 minutes   Wilmon Arms , M.D. Medical oncology     03/09/2013

## 2013-03-16 ENCOUNTER — Other Ambulatory Visit: Payer: Self-pay | Admitting: *Deleted

## 2013-03-16 DIAGNOSIS — C50919 Malignant neoplasm of unspecified site of unspecified female breast: Secondary | ICD-10-CM

## 2013-03-16 MED ORDER — LETROZOLE 2.5 MG PO TABS: 2.5000 mg | ORAL_TABLET | Freq: Every day | ORAL | Status: DC

## 2013-03-20 ENCOUNTER — Ambulatory Visit
Admission: RE | Admit: 2013-03-20 | Discharge: 2013-03-20 | Disposition: A | Discharge status: Home / Self Care | Payer: Self-pay | Source: Ambulatory Visit | Attending: Hematology and Oncology | Admitting: Hematology and Oncology

## 2013-03-20 ENCOUNTER — Ambulatory Visit
Admission: RE | Admit: 2013-03-20 | Discharge: 2013-03-20 | Disposition: A | Discharge status: Home / Self Care | Source: Ambulatory Visit | Attending: Hematology and Oncology | Admitting: Hematology and Oncology

## 2013-03-20 ENCOUNTER — Other Ambulatory Visit: Payer: Self-pay

## 2013-03-20 ENCOUNTER — Telehealth: Payer: Self-pay

## 2013-03-20 DIAGNOSIS — M7989 Other specified soft tissue disorders: Secondary | ICD-10-CM

## 2013-03-20 DIAGNOSIS — N644 Mastodynia: Secondary | ICD-10-CM

## 2013-03-20 DIAGNOSIS — C50919 Malignant neoplasm of unspecified site of unspecified female breast: Secondary | ICD-10-CM

## 2013-03-20 NOTE — Telephone Encounter (Signed)
Left message for patient to call back.  Per Dr Earnest Conroy, DEXA shows osteopenia.  If patient is not currently taking calcium and vitamin D she would like her to start Caltrate-D twice daily.

## 2013-05-19 ENCOUNTER — Ambulatory Visit: Payer: Self-pay | Admitting: Oncology

## 2013-05-26 ENCOUNTER — Telehealth: Payer: Self-pay | Admitting: Oncology

## 2013-05-26 NOTE — Telephone Encounter (Signed)
per 4/11 pof moved 5/11 f/u from GM to CP2. s/w pt she is aware of new time and that she will see PK.

## 2013-06-19 ENCOUNTER — Other Ambulatory Visit: Payer: Self-pay | Admitting: *Deleted

## 2013-06-19 DIAGNOSIS — C50911 Malignant neoplasm of unspecified site of right female breast: Secondary | ICD-10-CM

## 2013-06-22 ENCOUNTER — Other Ambulatory Visit: Payer: Medicaid Other

## 2013-09-09 ENCOUNTER — Ambulatory Visit: Payer: Medicaid Other | Admitting: Oncology

## 2013-09-09 ENCOUNTER — Other Ambulatory Visit: Payer: Medicaid Other

## 2013-10-29 ENCOUNTER — Emergency Department (HOSPITAL_COMMUNITY)
Admission: EM | Admit: 2013-10-29 | Discharge: 2013-10-29 | Disposition: A | Discharge status: Home / Self Care | Attending: Emergency Medicine | Admitting: Emergency Medicine

## 2013-10-29 ENCOUNTER — Encounter (HOSPITAL_COMMUNITY): Payer: Self-pay | Admitting: Emergency Medicine

## 2013-10-29 DIAGNOSIS — R21 Rash and other nonspecific skin eruption: Secondary | ICD-10-CM | POA: Insufficient documentation

## 2013-10-29 DIAGNOSIS — Z862 Personal history of diseases of the blood and blood-forming organs and certain disorders involving the immune mechanism: Secondary | ICD-10-CM | POA: Insufficient documentation

## 2013-10-29 DIAGNOSIS — Z791 Long term (current) use of non-steroidal anti-inflammatories (NSAID): Secondary | ICD-10-CM | POA: Insufficient documentation

## 2013-10-29 DIAGNOSIS — Z79899 Other long term (current) drug therapy: Secondary | ICD-10-CM | POA: Insufficient documentation

## 2013-10-29 DIAGNOSIS — Z853 Personal history of malignant neoplasm of breast: Secondary | ICD-10-CM | POA: Insufficient documentation

## 2013-10-29 MED ORDER — PERMETHRIN 5 % EX CREA: TOPICAL_CREAM | CUTANEOUS | Status: DC

## 2013-10-29 NOTE — ED Notes (Signed)
Rash/itching x 1 wk; rash over shoulders; breast; toes; ankles

## 2013-10-29 NOTE — Discharge Instructions (Signed)
Your rash is nonspecific. It may be scabies. Use Benadryl or Zyrtec, for itching. Followup with your primary care doctor, next week, for a checkup.   Rash A rash is a change in the color or texture of your skin. There are many different types of rashes. You may have other problems that accompany your rash. CAUSES   Infections.  Allergic reactions. This can include allergies to pets or foods.  Certain medicines.  Exposure to certain chemicals, soaps, or cosmetics.  Heat.  Exposure to poisonous plants.  Tumors, both cancerous and noncancerous. SYMPTOMS   Redness.  Scaly skin.  Itchy skin.  Dry or cracked skin.  Bumps.  Blisters.  Pain. DIAGNOSIS  Your caregiver may do a physical exam to determine what type of rash you have. A skin sample (biopsy) may be taken and examined under a microscope. TREATMENT  Treatment depends on the type of rash you have. Your caregiver may prescribe certain medicines. For serious conditions, you may need to see a skin doctor (dermatologist). HOME CARE INSTRUCTIONS   Avoid the substance that caused your rash.  Do not scratch your rash. This can cause infection.  You may take cool baths to help stop itching.  Only take over-the-counter or prescription medicines as directed by your caregiver.  Keep all follow-up appointments as directed by your caregiver. SEEK IMMEDIATE MEDICAL CARE IF:  You have increasing pain, swelling, or redness.  You have a fever.  You have new or severe symptoms.  You have body aches, diarrhea, or vomiting.  Your rash is not better after 3 days. MAKE SURE YOU:  Understand these instructions.  Will watch your condition.  Will get help right away if you are not doing well or get worse. Document Released: 01/19/2002 Document Revised: 04/23/2011 Document Reviewed: 11/13/2010 [facility] Patient Information 2015 [place], [place]. This information is not intended to replace advice given to you by your health  care provider. Make sure you discuss any questions you have with your health care provider.

## 2013-10-29 NOTE — Progress Notes (Signed)
[place],   Did not get to see patient but will be sending information about [place] Card application to help patient establish primary care.

## 2013-10-29 NOTE — ED Notes (Signed)
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

## 2013-10-29 NOTE — ED Provider Notes (Signed)
CSN: [id number]     Arrival date & time 10/29/13  0741 History   First MD Initiated Contact with Patient 10/29/13 0755     Chief Complaint  Patient presents with  . Rash     (Consider location/radiation/quality/duration/timing/severity/associated sxs/prior Treatment) Patient is a 58 y.o. female presenting with rash. The history is provided by the patient.  Rash  She presents for evaluation of pruritic rash that started several days ago. He worked, she has close contact with the patient, who has a whole-body rash, that was evaluated by a physician, who told the patient is a type of dermatitis. Patient does not have any other new known exposures. She denies fever, chills, nausea, vomiting, weakness, or dizziness. She has not tried anything for the rash yet. There are no other known modifying factors.  Past Medical History  Diagnosis Date  . Anemia   . Breast cancer   . S/P rotator cuff repair    Past Surgical History  Procedure Laterality Date  . Cholecystectomy    . Abdominal hysterectomy    . Breast surgery      partial mastectomy  . Rotator cuff repair     Family History  Problem Relation Age of Onset  . Hypertension Mother   . Heart disease Father    History  Substance Use Topics  . Smoking status: Never Smoker   . Smokeless tobacco: Not on file  . Alcohol Use: No   OB History   Grav Para Term Preterm Abortions TAB SAB Ect Mult Living                 Review of Systems  Skin: Positive for rash.  All other systems reviewed and are negative.     Allergies  Review of patient's allergies indicates no known allergies.  Home Medications   Prior to Admission medications   Medication Sig Start Date End Date Taking? Authorizing Provider  letrozole (FEMARA) 2.5 MG tablet Take 1 tablet (2.5 mg total) by mouth daily. 03/16/13  Yes Chauncey Cruel, MD  naproxen (NAPROSYN) 500 MG tablet Take 500 mg by mouth 2 (two) times daily with a meal.    Historical Provider, MD   permethrin (ELIMITE) 5 % cream Apply chin to toes, leave on 12 hours, then wash off 10/29/13   Richarda Blade, MD  zolpidem (AMBIEN) 5 MG tablet Take 1 tablet (5 mg total) by mouth at bedtime as needed for sleep. 11/29/11   Eston Esters, MD   BP 130/91  Pulse 68  Temp(Src) 98.1 F (36.7 C) (Oral)  Resp 16  SpO2 100% Physical Exam  Nursing note and vitals reviewed. Constitutional: She is oriented to person, place, and time. She appears well-developed and well-nourished.  HENT:  Head: Normocephalic and atraumatic.  Eyes: Conjunctivae and EOM are normal. Pupils are equal, round, and reactive to light.  Neck: Normal range of motion and phonation normal. Neck supple.  Cardiovascular: Normal rate.   Pulmonary/Chest: Effort normal. She exhibits no tenderness.  Musculoskeletal: Normal range of motion.  Neurological: She is alert and oriented to person, place, and time. She exhibits normal muscle tone.  Skin: Skin is warm and dry.  Scattered  3 mm red papules on the torso and bilateral upper arms, less than 15 in number, no associated vesiculation, with some excoriation. The rash is not present on forearms or hands, bilaterally.  Psychiatric: She has a normal mood and affect. Her behavior is normal. Judgment and thought content normal.  ED Course  Procedures (including critical care time)  Findings discussed with patient, all questions answered  Labs Review Labs Reviewed - No data to display  Imaging Review No results found.   EKG Interpretation None      MDM   Final diagnoses:  Rash   Nonspecific rash. Differential does include scabies. She can be treated symptomatically, and expectantly.  Nursing Notes Reviewed/ Care Coordinated Applicable Imaging Reviewed Interpretation of Laboratory Data incorporated into ED treatment  The patient appears reasonably screened and/or stabilized for discharge and I doubt any other medical condition or other [facility] requiring further  screening, evaluation, or treatment in the ED at this time prior to discharge.  Plan: Home Medications- Elimite; Home Treatments- rest; return here if the recommended treatment, does not improve the symptoms; Recommended follow up- PCP 1 week    Richarda Blade, MD 10/29/13 [phone]

## 2013-11-18 ENCOUNTER — Other Ambulatory Visit: Payer: Self-pay | Admitting: Hematology and Oncology

## 2013-11-18 DIAGNOSIS — Z853 Personal history of malignant neoplasm of breast: Secondary | ICD-10-CM

## 2013-11-25 ENCOUNTER — Ambulatory Visit
Admission: RE | Admit: 2013-11-25 | Discharge: 2013-11-25 | Disposition: A | Discharge status: Home / Self Care | Source: Ambulatory Visit | Attending: Hematology and Oncology | Admitting: Hematology and Oncology

## 2013-11-25 DIAGNOSIS — Z853 Personal history of malignant neoplasm of breast: Secondary | ICD-10-CM

## 2017-01-24 ENCOUNTER — Other Ambulatory Visit: Payer: Self-pay | Admitting: Family Medicine

## 2017-01-24 DIAGNOSIS — Z139 Encounter for screening, unspecified: Secondary | ICD-10-CM

## 2017-02-25 ENCOUNTER — Ambulatory Visit

## 2017-04-17 NOTE — Progress Notes (Unsigned)
This encounter was created in error - please disregard.  This encounter was created in error - please disregard.

## 2017-06-13 DIAGNOSIS — E119 Type 2 diabetes mellitus without complications: Secondary | ICD-10-CM | POA: Insufficient documentation

## 2017-06-13 DIAGNOSIS — Z853 Personal history of malignant neoplasm of breast: Secondary | ICD-10-CM | POA: Insufficient documentation

## 2017-06-20 ENCOUNTER — Inpatient Hospital Stay (EMERGENCY_DEPARTMENT_HOSPITAL)
Admission: AD | Admit: 2017-06-20 | Discharge: 2017-06-21 | Disposition: A | Discharge status: Home / Self Care | Payer: BLUE CROSS/BLUE SHIELD | Source: Ambulatory Visit | Attending: Obstetrics and Gynecology | Admitting: Obstetrics and Gynecology

## 2017-06-20 DIAGNOSIS — R1031 Right lower quadrant pain: Secondary | ICD-10-CM

## 2017-06-20 DIAGNOSIS — Z853 Personal history of malignant neoplasm of breast: Secondary | ICD-10-CM

## 2017-06-20 DIAGNOSIS — Z79899 Other long term (current) drug therapy: Secondary | ICD-10-CM | POA: Insufficient documentation

## 2017-06-21 ENCOUNTER — Other Ambulatory Visit: Payer: Self-pay

## 2017-06-21 ENCOUNTER — Encounter (HOSPITAL_COMMUNITY): Payer: Self-pay

## 2017-06-21 ENCOUNTER — Emergency Department (HOSPITAL_COMMUNITY)
Admission: EM | Admit: 2017-06-21 | Discharge: 2017-06-22 | Disposition: A | Discharge status: Home / Self Care | Payer: BLUE CROSS/BLUE SHIELD | Attending: Emergency Medicine | Admitting: Emergency Medicine

## 2017-06-21 DIAGNOSIS — R1031 Right lower quadrant pain: Secondary | ICD-10-CM

## 2017-06-21 DIAGNOSIS — N3000 Acute cystitis without hematuria: Secondary | ICD-10-CM | POA: Insufficient documentation

## 2017-06-21 DIAGNOSIS — R112 Nausea with vomiting, unspecified: Secondary | ICD-10-CM | POA: Diagnosis not present

## 2017-06-21 DIAGNOSIS — R509 Fever, unspecified: Secondary | ICD-10-CM | POA: Diagnosis not present

## 2017-06-21 LAB — COMPREHENSIVE METABOLIC PANEL
ALK PHOS: 54 U/L (ref 38–126)
ALT: 23 U/L (ref 14–54)
AST: 38 U/L (ref 15–41)
Albumin: 3.4 g/dL — ABNORMAL LOW (ref 3.5–5.0)
Anion gap: 13 (ref 5–15)
BUN: 7 mg/dL (ref 6–20)
CO2: 22 mmol/L (ref 22–32)
CREATININE: 0.71 mg/dL (ref 0.44–1.00)
Calcium: 8.8 mg/dL — ABNORMAL LOW (ref 8.9–10.3)
Chloride: 105 mmol/L (ref 101–111)
GFR calc non Af Amer: >60 mL/min (ref >60)
Glucose, Bld: 111 mg/dL — ABNORMAL HIGH (ref 65–99)
Potassium: 3.6 mmol/L (ref 3.5–5.1)
SODIUM: 140 mmol/L (ref 135–145)
Total Bilirubin: 0.3 mg/dL (ref 0.3–1.2)
Total Protein: 7.4 g/dL (ref 6.5–8.1)

## 2017-06-21 LAB — CBC
HCT: 36.8 % (ref 36.0–46.0)
HCT: 38.4 % (ref 36.0–46.0)
Hemoglobin: 11.3 g/dL — ABNORMAL LOW (ref 12.0–15.0)
Hemoglobin: 11.9 g/dL — ABNORMAL LOW (ref 12.0–15.0)
MCH: 24 pg — ABNORMAL LOW (ref 26.0–34.0)
MCH: 23.7 pg — AB (ref 26.0–34.0)
MCHC: 30.7 g/dL (ref 30.0–36.0)
MCHC: 31 g/dL (ref 30.0–36.0)
MCV: 78.3 fL (ref 78.0–100.0)
MCV: 76.3 fL — ABNORMAL LOW (ref 78.0–100.0)
PLATELETS: 197 10*3/uL (ref 150–400)
Platelets: 205 10*3/uL (ref 150–400)
RBC: 4.7 MIL/uL (ref 3.87–5.11)
RBC: 5.03 MIL/uL (ref 3.87–5.11)
RDW: 15 % (ref 11.5–15.5)
RDW: 15 % (ref 11.5–15.5)
WBC: 7.1 10*3/uL (ref 4.0–10.5)
WBC: 6.1 10*3/uL (ref 4.0–10.5)

## 2017-06-21 LAB — URINALYSIS, ROUTINE W REFLEX MICROSCOPIC
BILIRUBIN URINE: NEGATIVE
Bilirubin Urine: NEGATIVE
Glucose, UA: NEGATIVE mg/dL
Glucose, UA: NEGATIVE mg/dL
Ketones, ur: NEGATIVE mg/dL
Ketones, ur: NEGATIVE mg/dL
Nitrite: NEGATIVE
Nitrite: NEGATIVE
PH: 5 (ref 5.0–8.0)
PROTEIN: 30 mg/dL — AB
Protein, ur: NEGATIVE mg/dL
Specific Gravity, Urine: 1.012 (ref 1.005–1.030)
Specific Gravity, Urine: 1.015 (ref 1.005–1.030)
WBC, UA: >50 WBC/hpf — ABNORMAL HIGH (ref 0–5)
WBC, UA: >50 WBC/hpf — ABNORMAL HIGH (ref 0–5)
pH: 5 (ref 5.0–8.0)

## 2017-06-21 LAB — LIPASE, BLOOD: Lipase: 20 U/L (ref 11–51)

## 2017-06-21 LAB — PREGNANCY, URINE: Preg Test, Ur: NEGATIVE

## 2017-06-21 MED ORDER — ACETAMINOPHEN 500 MG PO TABS
1000.0000 mg | ORAL_TABLET | Freq: Once | ORAL | Status: AC
  Administered 2017-06-21: 1000 mg via ORAL
  Filled 2017-06-21: qty 2

## 2017-06-21 MED ORDER — ONDANSETRON 4 MG PO TBDP
4.0000 mg | ORAL_TABLET | Freq: Once | ORAL | Status: AC | PRN
  Administered 2017-06-21: 4 mg via ORAL
  Filled 2017-06-21: qty 1

## 2017-06-21 MED ORDER — ACETAMINOPHEN 325 MG PO TABS
650.0000 mg | ORAL_TABLET | Freq: Once | ORAL | Status: AC | PRN
  Administered 2017-06-21: 650 mg via ORAL
  Filled 2017-06-21: qty 2

## 2017-06-21 NOTE — Discharge Instructions (Signed)

## 2017-06-21 NOTE — ED Triage Notes (Signed)
Patient is complaining of lower right abdominal pain that started 4 days ago. Patient states she has been vomiting, having a fever, and being nauseas.

## 2017-06-21 NOTE — MAU Note (Signed)
Pt reports lower abdominal pain/cramping x 3days-more on right side. States she took 800mg  ibuprofen-around 1pm-helped some. Has not taken anything since. States tonight she had chills and a fever of 101.7. Has not taken anything. Hysterectomy in 2011. Pt denies constipation, diarrhea, nausea or vomiting. Pt denies pain or burning with urination. Pt states she had a colonoscopy last week and everything was normal.

## 2017-06-21 NOTE — MAU Provider Note (Signed)
History     CSN: [id number]  Arrival date and time: 06/20/17 2333   First Provider Initiated Contact with Patient 06/21/17 0106      Chief Complaint  Patient presents with  . Abdominal Pain  . Fever  . Headache   HPI  [name] is a 62 y.o. non pregnant female who presents with abdominal pain and fever. She had a TAH & BSO in 2011 by Dr. Corinna Capra.  Reports lower abdominal pain that is worse in RLQ x 3 days. Pain worse today and now has new symptom of fever at home. Describes sharp constant pain in her RLQ that she rates 10/10. Also endorses new frontal throbbing headache that she rates 10/10. Took ibuprofen this afternoon without relief. Nothing makes pain better or worse. Felt chills this evening and when she checked her temp it was 101.7.  Denies anorexia, n/v/d, constipation, dysuria, urinary frequency, vaginal bleeding, or vaginal discharge. Has appointment with Dr. Corinna Capra tomorrow morning for these symptoms.  Reports having a colonoscopy a few weeks ago that was normal.   Past Medical History:  Diagnosis Date  . Anemia   . Breast cancer ([place])   . S/P rotator cuff repair     Past Surgical History:  Procedure Laterality Date  . ABDOMINAL HYSTERECTOMY    . BREAST SURGERY     partial mastectomy  . CHOLECYSTECTOMY    . ROTATOR CUFF REPAIR      Family History  Problem Relation Age of Onset  . Hypertension Mother   . Heart disease Father     Social History   Tobacco Use  . Smoking status: Never Smoker  . Smokeless tobacco: Never Used  Substance Use Topics  . Alcohol use: No  . Drug use: No    Allergies: No Known Allergies  Medications Prior to Admission  Medication Sig Dispense Refill Last Dose  . Black Cohosh 40 MG CAPS Take 40 mg by mouth.   06/20/2017 at Unknown time  . cholecalciferol (VITAMIN D) 1000 units tablet Take 2,000 Units by mouth 2 (two) times daily.   Past Week at Unknown time  . ibuprofen (ADVIL,MOTRIN) 800 MG tablet Take 800 mg by mouth every 8  (eight) hours as needed.   06/20/2017 at 1300  . Omega-3 Fatty Acids (FISH OIL) 1000 MG CAPS Take 1,000 mg by mouth.   06/20/2017 at Unknown time  . letrozole (FEMARA) 2.5 MG tablet Take 1 tablet (2.5 mg total) by mouth daily. 90 tablet 3 10/28/2013 at Unknown time  . naproxen (NAPROSYN) 500 MG tablet Take 500 mg by mouth 2 (two) times daily with a meal.   Completed Course at Unknown time  . permethrin (ELIMITE) 5 % cream Apply chin to toes, leave on 12 hours, then wash off 60 g 1   . zolpidem (AMBIEN) 5 MG tablet Take 1 tablet (5 mg total) by mouth at bedtime as needed for sleep. 30 tablet 0 Completed Course at Unknown time    Review of Systems  Constitutional: Positive for chills and fever. Negative for appetite change.  Gastrointestinal: Positive for abdominal pain. Negative for abdominal distention, constipation, diarrhea, nausea and vomiting.  Genitourinary: Negative.   Neurological: Positive for headaches.   Physical Exam   Blood pressure 131/72, pulse (!) 112, temperature 99.6 F (37.6 C), temperature source Oral, resp. rate 18, height 5' (1.524 m), weight 194 lb (88 kg), SpO2 96 %.  Physical Exam  Nursing note and vitals reviewed. Constitutional: She is oriented to person, place,  and time. She appears well-developed and well-nourished. No distress.  HENT:  Head: Normocephalic and atraumatic.  Eyes: Conjunctivae are normal. Right eye exhibits no discharge. Left eye exhibits no discharge. No scleral icterus.  Neck: Normal range of motion.  Cardiovascular: Normal rate, regular rhythm and normal heart sounds.  No murmur heard. Respiratory: Effort normal and breath sounds normal. No respiratory distress. She has no wheezes.  GI: Soft. Bowel sounds are normal. She exhibits no distension. There is tenderness in the right upper quadrant and right lower quadrant. There is rebound. There is no rigidity and no guarding.  Neurological: She is alert and oriented to person, place, and time.   Skin: Skin is warm and dry. She is not diaphoretic.  Psychiatric: She has a normal mood and affect. Her behavior is normal. Judgment and thought content normal.    MAU Course  Procedures Results for orders placed or performed during the hospital encounter of 06/20/17 (from the past 24 hour(s))  Urinalysis, Routine w reflex microscopic     Status: Abnormal   Collection Time: 06/21/17 12:00 AM  Result Value Ref Range   Color, Urine YELLOW YELLOW   APPearance HAZY (A) CLEAR   Specific Gravity, Urine 1.015 1.005 - 1.030   pH 5.0 5.0 - 8.0   Glucose, UA NEGATIVE NEGATIVE mg/dL   Hgb urine dipstick MODERATE (A) NEGATIVE   Bilirubin Urine NEGATIVE NEGATIVE   Ketones, ur NEGATIVE NEGATIVE mg/dL   Protein, ur 30 (A) NEGATIVE mg/dL   Nitrite NEGATIVE NEGATIVE   Leukocytes, UA LARGE (A) NEGATIVE   RBC / HPF 6-10 0 - 5 RBC/hpf   WBC, UA >50 (H) 0 - 5 WBC/hpf   Bacteria, UA RARE (A) NONE SEEN   Squamous Epithelial / LPF 0-5 0 - 5   WBC Clumps PRESENT    Mucus PRESENT   CBC     Status: Abnormal   Collection Time: 06/21/17  1:52 AM  Result Value Ref Range   WBC 7.1 4.0 - 10.5 K/uL   RBC 4.70 3.87 - 5.11 MIL/uL   Hemoglobin 11.3 (L) 12.0 - 15.0 g/dL   HCT 36.8 36.0 - 46.0 %   MCV 78.3 78.0 - 100.0 fL   MCH 24.0 (L) 26.0 - 34.0 pg   MCHC 30.7 30.0 - 36.0 g/dL   RDW 15.0 11.5 - 15.5 %   Platelets 197 150 - 400 K/uL    MDM On abdominal exam; pt TTP in RUQ & RLQ with mild rebound. BS x4. No distension, rigidity, or guarding.  CBC --- no leukocytosis Pt given 1 gm tylenol --- Headache resolved & abdominal pain down to 3/10. Temp down to 99.6  C/w Dr. Gaetano Net. Notified of exam, hx, VS, labs, & tx. Symptoms not gyn related. Pt improved and no elevate WBC. Will discharge home. Pt can keep f/u with Dr. Corinna Capra but to go to ED if symptoms worsen.   Assessment and Plan  A; 1. Right lower quadrant abdominal pain    P: Discharge home Keep f/u in office Stressed if symptoms return to go  straight to [facility] or [facility] -- pt understands  Jorje Guild 06/21/2017, 1:06 AM

## 2017-06-22 ENCOUNTER — Encounter (HOSPITAL_COMMUNITY): Payer: Self-pay

## 2017-06-22 ENCOUNTER — Emergency Department (HOSPITAL_COMMUNITY): Payer: BLUE CROSS/BLUE SHIELD

## 2017-06-22 MED ORDER — SODIUM CHLORIDE 0.9 % IV SOLN
1.0000 g | Freq: Once | INTRAVENOUS | Status: AC
  Administered 2017-06-22: 1 g via INTRAVENOUS
  Filled 2017-06-22: qty 10

## 2017-06-22 MED ORDER — CEPHALEXIN 500 MG PO CAPS: 1000.0000 mg | ORAL_CAPSULE | Freq: Two times a day (BID) | ORAL | 0 refills | Status: DC

## 2017-06-22 MED ORDER — HYDROCODONE-ACETAMINOPHEN 5-325 MG PO TABS: 1.0000 | ORAL_TABLET | ORAL | 0 refills | Status: DC | PRN

## 2017-06-22 MED ORDER — IOPAMIDOL (ISOVUE-300) INJECTION 61%
INTRAVENOUS | Status: AC
  Filled 2017-06-22: qty 100

## 2017-06-22 MED ORDER — HYDROMORPHONE HCL 1 MG/ML IJ SOLN
0.5000 mg | Freq: Once | INTRAMUSCULAR | Status: AC
  Administered 2017-06-22: 0.5 mg via INTRAVENOUS
  Filled 2017-06-22: qty 1

## 2017-06-22 MED ORDER — ONDANSETRON 4 MG PO TBDP: 4.0000 mg | ORAL_TABLET | Freq: Three times a day (TID) | ORAL | 0 refills | Status: DC | PRN

## 2017-06-22 MED ORDER — IOPAMIDOL (ISOVUE-300) INJECTION 61%
100.0000 mL | Freq: Once | INTRAVENOUS | Status: AC | PRN
  Administered 2017-06-22: 100 mL via INTRAVENOUS

## 2017-06-22 NOTE — ED Provider Notes (Signed)
[facility] DEPT Provider Note   CSN: [id number] Arrival date & time: 06/21/17  1942     History   Chief Complaint Chief Complaint  Patient presents with  . Abdominal Pain  . Emesis  . Nausea    HPI [name] is a 62 y.o. female.  The patient presents with pain in the right side abdomen, involving the right lower back x 4 days. She has had nausea with vomiting and a fever at home to 102. She denies urinary symptoms, diarrhea, SOB, cough or URI symptoms. She went to [facility] hospital last night and was reassured that her work up was normal. She comes in tonight for further evaluation of persistent symptoms and uncontrolled, marked pain.   The history is provided by the patient and the spouse.  Abdominal Pain   Associated symptoms include fever, nausea and vomiting. Pertinent negatives include diarrhea, dysuria and frequency.  Emesis   Associated symptoms include abdominal pain, chills and a fever. Pertinent negatives include no diarrhea.    Past Medical History:  Diagnosis Date  . Anemia   . Breast cancer ([place])   . S/P rotator cuff repair     Patient Active Problem List   Diagnosis Date Noted  . Breast cancer, right breast ([place]) 05/14/2011    Past Surgical History:  Procedure Laterality Date  . ABDOMINAL HYSTERECTOMY    . BREAST SURGERY     partial mastectomy  . CHOLECYSTECTOMY    . ROTATOR CUFF REPAIR       OB History    Gravida  4   Para  4   Term  4   Preterm  0   AB  0   Living  3     SAB  0   TAB  0   Ectopic  0   Multiple  0   Live Births  4            Home Medications    Prior to Admission medications   Medication Sig Start Date End Date Taking? Authorizing Provider  Black Cohosh 40 MG CAPS Take 40 mg by mouth.    [provider]  cholecalciferol (VITAMIN D) 1000 units tablet Take 2,000 Units by mouth 2 (two) times daily.    [provider]  ibuprofen (ADVIL,MOTRIN) 800  MG tablet Take 800 mg by mouth every 8 (eight) hours as needed.    [provider]  Omega-3 Fatty Acids (FISH OIL) 1000 MG CAPS Take 1,000 mg by mouth.    [provider]    Family History Family History  Problem Relation Age of Onset  . Hypertension Mother   . Heart disease Father     Social History Social History   Tobacco Use  . Smoking status: Never Smoker  . Smokeless tobacco: Never Used  Substance Use Topics  . Alcohol use: No  . Drug use: No     Allergies   Patient has no known allergies.   Review of Systems Review of Systems  Constitutional: Positive for chills and fever.  HENT: Negative.   Respiratory: Negative.  Negative for shortness of breath.   Cardiovascular: Negative.  Negative for chest pain.  Gastrointestinal: Positive for abdominal pain, nausea and vomiting. Negative for diarrhea.  Genitourinary: Negative.  Negative for dysuria, flank pain and frequency.  Musculoskeletal: Positive for back pain.  Skin: Negative.   Neurological: Negative.      Physical Exam Updated Vital Signs BP 102/65 (BP Location: Right Arm)  Pulse (!) 108   Temp 99.1 F (37.3 C) (Oral)   Resp 18   Ht 5' (1.524 m)   Wt 85.7 kg (189 lb)   SpO2 96%   BMI 36.91 kg/m   Physical Exam  Constitutional: She appears well-developed and well-nourished.  HENT:  Head: Normocephalic.  Neck: Normal range of motion. Neck supple.  Cardiovascular: Normal rate and regular rhythm.  Pulmonary/Chest: Effort normal and breath sounds normal.  Abdominal: Soft. Bowel sounds are normal. There is tenderness in the right lower quadrant. There is guarding. There is no rebound and no CVA tenderness.  Musculoskeletal: Normal range of motion.  Neurological: She is alert. No cranial nerve deficit.  Skin: Skin is warm and dry. No rash noted.  Psychiatric: She has a normal mood and affect.     ED Treatments / Results  Labs (all labs ordered are listed, but only abnormal  results are displayed) Labs Reviewed  COMPREHENSIVE METABOLIC PANEL - Abnormal; Notable for the following components:      Result Value   Glucose, Bld 111 (*)    Calcium 8.8 (*)    Albumin 3.4 (*)    All other components within normal limits  CBC - Abnormal; Notable for the following components:   Hemoglobin 11.9 (*)    MCV 76.3 (*)    MCH 23.7 (*)    All other components within normal limits  URINALYSIS, ROUTINE W REFLEX MICROSCOPIC - Abnormal; Notable for the following components:   APPearance HAZY (*)    Hgb urine dipstick SMALL (*)    Leukocytes, UA SMALL (*)    WBC, UA >50 (*)    Bacteria, UA FEW (*)    All other components within normal limits  LIPASE, BLOOD  PREGNANCY, URINE    EKG None  Radiology No results found.  Procedures Procedures (including critical care time)  Medications Ordered in ED Medications  iopamidol (ISOVUE-300) 61 % injection (has no administration in time range)  ondansetron (ZOFRAN-ODT) disintegrating tablet 4 mg (4 mg Oral Given 06/21/17 2013)  acetaminophen (TYLENOL) tablet 650 mg (650 mg Oral Given 06/21/17 2013)  HYDROmorphone (DILAUDID) injection 0.5 mg (0.5 mg Intravenous Given 06/22/17 0043)  iopamidol (ISOVUE-300) 61 % injection 100 mL (100 mLs Intravenous Contrast Given 06/22/17 0058)     Initial Impression / Assessment and Plan / ED Course  I have reviewed the triage vital signs and the nursing notes.  Pertinent labs & imaging results that were available during my care of the patient were reviewed by me and considered in my medical decision making (see chart for details).     Patient is here for evaluation of RLQ abdominal pain, fever and vomiting x 4 days.   Labs are essentially unremarkable. No leukocytosis. She has >50 WBC in urine - consider pyelonephritis. She has had a full hysterectomy but does not know whether her appendix was taken at that time.   IV medications ordered. CT scan of abd/pel pending for complete  evaluation.   CT scan is unremarkable. Normal appendix visualized. No renal inflammation.   Will treat for UTI based on symptoms and UA. Culture from [facility] pending. Follow up with PCP in one week to insure UTI is resolved.   Final Clinical Impressions(s) / ED Diagnoses   Final diagnoses:  None   1. UTI  ED Discharge Orders    None       Charlann Lange, PA-C 06/22/17 0867    Varney Biles, MD 06/22/17 [phone]

## 2017-06-23 LAB — CULTURE, OB URINE

## 2017-06-25 ENCOUNTER — Other Ambulatory Visit: Payer: Self-pay | Admitting: Family Medicine

## 2017-06-25 ENCOUNTER — Ambulatory Visit
Admission: RE | Admit: 2017-06-25 | Discharge: 2017-06-25 | Disposition: A | Discharge status: Home / Self Care | Source: Ambulatory Visit | Attending: Family Medicine | Admitting: Family Medicine

## 2017-06-25 DIAGNOSIS — R109 Unspecified abdominal pain: Secondary | ICD-10-CM

## 2017-06-25 MED ORDER — IOHEXOL 300 MG/ML  SOLN
100.0000 mL | Freq: Once | INTRAMUSCULAR | Status: AC | PRN
  Administered 2017-06-25: 100 mL via INTRAVENOUS

## 2019-04-20 ENCOUNTER — Ambulatory Visit (INDEPENDENT_AMBULATORY_CARE_PROVIDER_SITE_OTHER): Payer: PRIVATE HEALTH INSURANCE | Admitting: Family Medicine

## 2019-04-20 ENCOUNTER — Encounter: Payer: Self-pay | Admitting: Family Medicine

## 2019-04-20 ENCOUNTER — Other Ambulatory Visit: Payer: Self-pay

## 2019-04-20 ENCOUNTER — Ambulatory Visit: Payer: Self-pay

## 2019-04-20 VITALS — BP 144/88 | HR 68 | Ht 60.0 in | Wt 204.4 lb

## 2019-04-20 DIAGNOSIS — M79671 Pain in right foot: Secondary | ICD-10-CM

## 2019-04-20 DIAGNOSIS — M9261 Juvenile osteochondrosis of tarsus, right ankle: Secondary | ICD-10-CM | POA: Diagnosis not present

## 2019-04-20 DIAGNOSIS — M722 Plantar fascial fibromatosis: Secondary | ICD-10-CM

## 2019-04-20 MED ORDER — NITROGLYCERIN 0.2 MG/HR TD PT24: MEDICATED_PATCH | TRANSDERMAL | 1 refills | Status: DC

## 2019-04-20 NOTE — Patient Instructions (Signed)
Thank you for coming in today. Use the cam walker boot.  Try to do the stretching with a band.  Use the nitro patches.  Recheck in 2 weeks.   Nitroglycerin Protocol   Apply 1/4 nitroglycerin patch to affected area daily.  Change position of patch within the affected area every 24 hours.  You may experience a headache during the first 1-2 weeks of using the patch, these should subside.  If you experience headaches after beginning nitroglycerin patch treatment, you may take your preferred over the counter pain reliever.  Another side effect of the nitroglycerin patch is skin irritation or rash related to patch adhesive.  Please notify our office if you develop more severe headaches or rash, and stop the patch.  Tendon healing with nitroglycerin patch may require 12 to 24 weeks depending on the extent of injury.  Men should not use if taking Viagra, Cialis, or Levitra.   Do not use if you have migraines or rosacea.

## 2019-04-20 NOTE — Progress Notes (Signed)
Subjective:    CC: R foot pain  I, [name], LAT, ATC, am serving as scribe for Dr. Lynne Leader.  HPI: Pt is a 64 y/o female presenting w/ c/o R foot pain since x one month w/ no known MOI.  She states that she went to see a foot doctor about 2 weeks ago and had an injection in her L heel.  [facility] . she reports immediate improvement but then the pain returned 2 days later.  She locates her pain to her R lateral calcaneus and into her R Achille's.  She rates her R foot pain at a 20/10 and describes her pain as burning and throbbing.  At the podiatrist she had a steroid injection and x-rays.  X-rays were reportedly normal.  Radiating pain: Yes into her R Achille's R foot swelling: No R foot numbness/tingling: No Aggravating factors: Walking, weight-bearing, pressure from her shoe Treatments tried: Relafen; HEP per the foot doctor  Pertinent review of Systems: No fever or chills.   Relevant historical information: History of breast cancer: Locally advanced ER/PR positive breast cancer status post neoadjuvant Femara therapy started in November 2011, status post lumpectomy June 2012 with residual T1 C. N0 disease status post radiation therapy completed 10/19/2010, on ongoing Femara therapy   Objective:    Vitals:   04/20/19 1045  BP: (!) 144/88  Pulse: 68  SpO2: 99%   General: Well Developed, well nourished, and in no acute distress.   MSK: left leg Left knee: Normal-appearing nontender normal motion. Left ankle: Normal-appearing nontender normal motion. Left foot: Normal-appearing Tender palpation posterior calcaneus at insertion of Achilles tendon. Also tender palpation plantar lateral calcaneus. Foot is otherwise nontender with normal motion pulses cap refill and sensation.  Lab and Radiology Results  Diagnostic Limited MSK Ultrasound of: Left foot Achilles tendon normal-appearing.  At insertion slight area of hyperechoic change indicating  Haglund deformity.  No hypoechoic change or retrocalcaneal bursitis. Plantar fascial origin on calcaneus visible.  Patient has intact appearing plantar fascia with no obvious tear. Hypoechoic change at plantar lateral calcaneus near area of tenderness overlying bone.  No obvious bone defect visible. Impression: Achilles tendinopathy and plantar fasciitis with possible contusion or edema of os calcis    Impression and Recommendations:    Assessment and Plan: 64 y.o. female with left foot and ankle pain secondary to plantar fasciitis and Achilles tendinopathy.  Patient may have a more serious significant injury than initially understood.  She reportedly did have x-rays and ultrasound and podiatry.  I will obtain medical records request to follow-up medical records. However in the interim we will proceed with conservative management.  Given her significant pain and her inability to work will transition to cam walker boot for about 2 weeks.  This will hopefully allow her to walk more normally and complete work activities.  Additionally start home exercise program and nitroglycerin patch protocol.  Check back in 2 weeks.  Work note provided.Marland Kitchen  PDMP not reviewed this encounter. Orders Placed This Encounter  Procedures  . [place] LIMITED JOINT SPACE STRUCTURES LOW RIGHT(NO LINKED CHARGES)    Order Specific Question:   Reason for Exam (SYMPTOM  OR DIAGNOSIS REQUIRED)    Answer:   R foot pain    Order Specific Question:   Preferred imaging location?    Answer:   [place]   Meds ordered this encounter  Medications  . nitroGLYCERIN (NITRODUR - DOSED IN MG/24 HR) 0.2  mg/hr patch    Sig: Apply 1/4 patch daily to tendon for tendonitis.    Dispense:  30 patch    Refill:  1    Discussed warning signs or symptoms. Please see discharge instructions. Patient expresses understanding.   The above documentation has been reviewed and is accurate and complete Lynne Leader

## 2019-04-22 ENCOUNTER — Telehealth: Payer: Self-pay | Admitting: Family Medicine

## 2019-04-22 MED ORDER — NITROGLYCERIN 0.2 MG/HR TD PT24: MEDICATED_PATCH | TRANSDERMAL | 1 refills | Status: DC

## 2019-04-22 NOTE — Telephone Encounter (Signed)
[facility].  I called and left a message with the patient's voicemail. Informed her that I [facility]. As for the Relafen.  That medicine is very much like ibuprofen.  If she feels like it helps it is reasonable to take it however she feels like is not helping much is reasonable to stop. Advised her to call back if she has any further questions.

## 2019-04-22 NOTE — Telephone Encounter (Signed)
Pt was advised by [facility] on [address] that the Nitroglycerin is on backorder, did not provide any expected arrival date. Pt is in pain and unsure what to do without the med.  Also, did Dr. Georgina Snell want pt to continue taking the Relafen?  Please call pt today if possible

## 2019-04-22 NOTE — Telephone Encounter (Signed)
Returned pt's call and she states that we can [facility] [address] since they are on backorder at [facility] on [address].  Please advise if she is to con't taking the Relafen.

## 2019-04-23 NOTE — Telephone Encounter (Signed)
Patient called back regarding the nitroglycerin patches. [facility] said that [facility] [address] [facility] not [facility] in [facility].  She asked if there is anything else that can be prescribed to help the pain? Her foot is hurting and burning so bad, she can hardly walk.  She also mentioned that she is okay to get the MRI if Dr Georgina Snell would like for her to do so. She just needs some relief.   Given MD response regarding the Relafen. She said that it is not helping, so she will stop taking it.  Please advise.

## 2019-04-24 MED ORDER — NITROGLYCERIN 0.2 MG/HR TD PT24: MEDICATED_PATCH | TRANSDERMAL | 1 refills | Status: DC

## 2019-04-24 MED ORDER — TRAMADOL HCL 50 MG PO TABS: 50.0000 mg | ORAL_TABLET | Freq: Three times a day (TID) | ORAL | 0 refills | Status: DC | PRN

## 2019-04-24 NOTE — Telephone Encounter (Signed)
Returned pt's call and informed her that we have [facility] [address] and that we found nitro patches in stock at [facility].  Pt states that she actually [facility] and states that she used 2 whole patches on her heel/foot.  I advise her that she is supposed to only use a quarter of a patch and that she can use a 1/4 patch at 2 different spots on her foot but that 2 whole patches is too much.  Pt verbalizes understanding and states that she will decrease to a quarter of a patch when she changes her patches next.  She states that they are helping w/ her pain.

## 2019-04-24 NOTE — Telephone Encounter (Signed)
[facility] [address]. Tramadol should help with pain.  Additionally I have found nitroglycerin patches to be in stock at [facility]. Phone number to [facility] is [phone].  They often can mail the medicine directly to you as well.  Please contact [facility] to discuss how to get your nitroglycerin patches.  [name]

## 2019-04-24 NOTE — Addendum Note (Signed)
Addended by: Gregor Hams on: 04/24/2019 08:51 AM   Modules accepted: Orders

## 2019-05-04 ENCOUNTER — Ambulatory Visit (INDEPENDENT_AMBULATORY_CARE_PROVIDER_SITE_OTHER): Payer: PRIVATE HEALTH INSURANCE | Admitting: Family Medicine

## 2019-05-04 ENCOUNTER — Other Ambulatory Visit: Payer: Self-pay

## 2019-05-04 ENCOUNTER — Encounter: Payer: Self-pay | Admitting: Family Medicine

## 2019-05-04 VITALS — BP 150/96 | HR 70 | Ht 60.0 in | Wt 206.8 lb

## 2019-05-04 DIAGNOSIS — M9261 Juvenile osteochondrosis of tarsus, right ankle: Secondary | ICD-10-CM

## 2019-05-04 DIAGNOSIS — M722 Plantar fascial fibromatosis: Secondary | ICD-10-CM

## 2019-05-04 DIAGNOSIS — M79671 Pain in right foot: Secondary | ICD-10-CM | POA: Diagnosis not present

## 2019-05-04 NOTE — Patient Instructions (Addendum)
Thank you for coming in today. Plan for MRI.  You should hear soon about scheduling.  Phone number for MRI in [place] is [phone] Let me know if you do not hear anything   Recheck after MRI.   Keep doing the exercises.  Use the boot as needed.  Ok to increase the nitroglycerine to 1/2 patch if you are tolerating it well.

## 2019-05-04 NOTE — Progress Notes (Signed)
   I, [name], LAT, ATC, am serving as scribe for Dr. Lynne Leader.  [name] is a 64 y.o. female who presents to [place] at [facility] today for f/u of R calcaneus and R Achille's pain.  She was last seen by Dr. Georgina Snell on 04/20/19 and was placed into a CAM walker, given a HEP focusing on post tib eccentrics and prescribed nitroglycerin patches.  Since her last visit, pt reports that she was initially doing better w/ the nitro patches when she was accidentally using 2 full patches.  Once pt was advised to only use a quarter of a patch, then she notes no relief in her pain.  She con't to have plantar heel pain and R Achille's pain.  She con't to wear her CAM walking boot while at work and up on her feet.  She has been doing her HEP as prescribed at her last visit.   Pertinent review of systems: No fevers or chills  Relevant historical information: History of breast cancer   Exam:  BP (!) 150/96 (BP Location: Left Arm, Patient Position: Sitting, Cuff Size: Large)   Pulse 70   Ht 5' (1.524 m)   Wt 206 lb 12.8 oz (93.8 kg)   SpO2 98%   BMI 40.39 kg/m  General: Well Developed, well nourished, and in no acute distress.   MSK: Right foot tender palpation lateral plantar calcaneus and posterior calcaneus.  Otherwise normal-appearing      Assessment and Plan: 64 y.o. female with 1 month right plantar and posterior heel pain.  Possibly due to Achilles tendon tendinitis and plantar fasciitis however I am concerned for calcaneal stress fracture.  She has significant failure to improve with conservative management including x-rays by podiatry, injection by podiatry, eccentric exercise trial with me along with nitroglycerin patch protocol as well as cam walker boot.  At this point she is unable to work because of her pain and is significantly disabled.  Plan for MRI to further characterize possibility of calcaneus stress fracture and recheck following MRI.   PDMP not  reviewed this encounter. Orders Placed This Encounter  Procedures  . MR HEEL RIGHT WO CONTRAST    Standing Status:   Future    Standing Expiration Date:   07/03/2020    Order Specific Question:   ** REASON FOR EXAM (FREE TEXT)    Answer:   eval heel pain suspect stress fx and tendonits    Order Specific Question:   What is the patient's sedation requirement?    Answer:   No Sedation    Order Specific Question:   Does the patient have a pacemaker or implanted devices?    Answer:   No    Order Specific Question:   Preferred imaging location?    Answer:   [facility] (table limit-350lbs)    Order Specific Question:   Radiology Contrast Protocol - do NOT remove file path    Answer:   \\[email or web address]   No orders of the defined types were placed in this encounter.    Discussed warning signs or symptoms. Please see discharge instructions. Patient expresses understanding.   The above documentation has been reviewed and is accurate and complete Lynne Leader

## 2019-05-24 ENCOUNTER — Ambulatory Visit (INDEPENDENT_AMBULATORY_CARE_PROVIDER_SITE_OTHER): Payer: PRIVATE HEALTH INSURANCE

## 2019-05-24 ENCOUNTER — Other Ambulatory Visit: Payer: Self-pay

## 2019-05-24 DIAGNOSIS — M79671 Pain in right foot: Secondary | ICD-10-CM

## 2019-05-24 DIAGNOSIS — M9261 Juvenile osteochondrosis of tarsus, right ankle: Secondary | ICD-10-CM

## 2019-05-25 NOTE — Progress Notes (Signed)
MRI heel shows plantar fasciitis without tear or rupture.It also shows mild Achilles tendinitis.No stress fracture or severe bone changes.  Recommend recheck in clinic in the near future.

## 2019-06-01 ENCOUNTER — Encounter: Payer: Self-pay | Admitting: Family Medicine

## 2019-06-01 ENCOUNTER — Ambulatory Visit (INDEPENDENT_AMBULATORY_CARE_PROVIDER_SITE_OTHER): Payer: PRIVATE HEALTH INSURANCE | Admitting: Family Medicine

## 2019-06-01 ENCOUNTER — Other Ambulatory Visit: Payer: Self-pay

## 2019-06-01 VITALS — BP 138/88 | HR 97 | Ht 60.0 in | Wt 206.0 lb

## 2019-06-01 DIAGNOSIS — M7661 Achilles tendinitis, right leg: Secondary | ICD-10-CM | POA: Diagnosis not present

## 2019-06-01 DIAGNOSIS — M722 Plantar fascial fibromatosis: Secondary | ICD-10-CM

## 2019-06-01 NOTE — Progress Notes (Signed)
[name] is a 64 y.o. female who presents to [place] at [facility] today for Follow up of R heel MRI. Patient las saw Dr. Georgina Snell 05/04/2019 for R foot pain and reported con't plantar heel pain and R Achille's pain.  She con't to wear her CAM walking boot while at work and up on her feet.  She has been doing her HEP as prescribed at her last visit. Since last visit patient reports she is feeling the exact same as she was. States she cannot work with this pain and wants to know where we go from here.  She notes [facility] did not foot injection about a month ago that only provided relief for a few days.  At this point she would rather have surgery then another injection.  Had a cortisone shot about 1 month ago. [place].  [address], [place], [place] [address] Phone: [phone] Fax: [phone]   Pertinent review of systems: No fevers or chills  Relevant historical information: History of breast cancer   Exam:  BP 138/88 (BP Location: Left Arm, Patient Position: Sitting, Cuff Size: Large)   Pulse 97   Ht 5' (1.524 m)   Wt 206 lb (93.4 kg)   SpO2 98%   BMI 40.23 kg/m  General: Well Developed, well nourished, and in no acute distress.   MSK: Right foot: Normal-appearing Tender palpation plantar calcaneus and posterior calcaneus.  Normal foot and ankle motion.  Stable ligamentous exam.  Intact strength.    Lab and Radiology Results EXAM: MR OF THE RIGHT HEEL WITHOUT CONTRAST  TECHNIQUE: Multiplanar, multisequence MR imaging of the ankle was performed. No intravenous contrast was administered.  COMPARISON:  None.  FINDINGS: TENDONS  Peroneal: Intact. No significant tendinopathy or tenosynovitis.  Posteromedial: Intact. No significant tendinopathy or tenosynovitis.  Anterior: Intact. No significant tendinopathy or tenosynovitis.  Achilles: Mild tendinopathy with slight  thickening.  Plantar Fascia: Moderate changes of plantar fasciitis with thickening and increased T2 signal intensity involving the near attachment fibers of the plantar fascia along with reactive marrow edema in the calcaneus. There is also some associated edema like signal changes in the short flexor muscles proximally. No discrete full-thickness tear/rupture.  LIGAMENTS  Lateral: Intact  Medial: Intact  CARTILAGE  Ankle Joint: Mild/early degenerative changes but no cartilage defects or osteochondral lesion. No joint effusion.  Subtalar Joints/Sinus Tarsi: The subtalar joints are maintained. The sinus tarsi is unremarkable. The cervical and interosseous ligaments are intact.  Bones: No acute bony findings. No stress fracture, AVN or osteochondral lesions. Minimal/mild midfoot degenerative changes.  Other: Unremarkable subcutaneous tissues.  IMPRESSION: 1. Moderate changes of plantar fasciitis. No full-thickness tear/rupture. 2. Intact medial and lateral ankle ligaments and tendons. 3. Mild Achilles tendinopathy. 4. No stress fracture or osteochondral abnormality.   Electronically Signed   By: Marijo Sanes M.D.   On: 05/25/2019 09:30  I, Lynne Leader, personally (independently) visualized and performed the interpretation of the images attached in this note.     Assessment and Plan: 64 y.o. female with persistent pain right heel.  Pain multifactorial.  Predominantly due to plantar fasciitis.  Patient also has some insertional Achilles tendinitis.  She is significantly bothered by this pain and failing typical conservative management.  Discussed options.  Reasonable to consider another injection today however I am not very optimistic about it.  At this point she is ready to consider surgery which I think is also  reasonable.  Provided a few surgical options.  She will follow up back with [facility] to discuss surgical options.  I  advised her to bring a CD of her MRI imaging.  I will send a copy of today's note as well.  Total encounter time 20 minutes including charting time date of service. Discussed MRI findings injection versus surgical options.   Discussed warning signs or symptoms. Please see discharge instructions. Patient expresses understanding.   The above documentation has been reviewed and is accurate and complete Lynne Leader   CC: Podiatry at [facility]

## 2019-06-01 NOTE — Patient Instructions (Addendum)
Thank you for coming in today. Call [facility] at [facility] [phone] and ask her to make you a CD of MRI . Get the CD mailed to pick it up and bring it with you when you see your podiatrist to consider Surgery,  If you plan for surgery your Surgeon will take over work restrictions.   Let me know if you have a problem or your need help.

## 2019-06-04 ENCOUNTER — Telehealth: Payer: Self-pay | Admitting: Family Medicine

## 2019-06-04 DIAGNOSIS — M722 Plantar fascial fibromatosis: Secondary | ICD-10-CM

## 2019-06-04 NOTE — Telephone Encounter (Signed)
Patient called stating that she is scheduled to see Dr Barkley Bruns with Podiatry on May 3rd but she does not want to wait that long. She asked if there was anyone else that Dr Georgina Snell would recommend that might could see her sooner. If she waits until then, it is going to push her past the short-term disability coverage window.  Please advise.

## 2019-06-04 NOTE — Telephone Encounter (Signed)
Called pt and relayed Dr. Clovis Riley message.  She verbalizes understanding and has no additional questions/concerns.

## 2019-06-04 NOTE — Telephone Encounter (Signed)
I believe I can get you in with a different podiatrist. [facility] here in [place] should be able to see you sooner.  I know Dr. Carman Ching there and I think you do a great job.  However I think his colleagues would also do a great job if he can get you in soon.  I placed a referral.  Please call and schedule an appointment. [place], [place], [place] [address] Phone: [phone]

## 2019-06-16 ENCOUNTER — Ambulatory Visit: Payer: 59 | Admitting: Podiatry

## 2019-06-16 ENCOUNTER — Encounter: Payer: Self-pay | Admitting: Podiatry

## 2019-06-16 ENCOUNTER — Other Ambulatory Visit: Payer: Self-pay

## 2019-06-16 ENCOUNTER — Ambulatory Visit (INDEPENDENT_AMBULATORY_CARE_PROVIDER_SITE_OTHER): Payer: 59

## 2019-06-16 VITALS — Temp 96.6°F

## 2019-06-16 DIAGNOSIS — M79671 Pain in right foot: Secondary | ICD-10-CM

## 2019-06-16 DIAGNOSIS — M722 Plantar fascial fibromatosis: Secondary | ICD-10-CM | POA: Diagnosis not present

## 2019-06-16 NOTE — Patient Instructions (Signed)

## 2019-06-18 NOTE — Progress Notes (Signed)
Subjective:   Patient ID: [name], female   DOB: 64 y.o.   MRN: [id number]   HPI 64 year old female presents the office today for concerns of pain to the bottom of her right foot. Is been ongoing for greater than 3 months. She previously seen another podiatrist where she had steroid injection which did help some but she does not want another injection. She is also been doing stretching, icing wearing supportive shoes, orthotics and anti-inflammatories which has not been helping her pain. At this time she presents today for possible surgical consultation. She occasionally does get some pain to the back of her heel but the majority pain is in the bottom.   Review of Systems  All other systems reviewed and are negative.  Past Medical History:  Diagnosis Date  . Anemia   . Breast cancer ([place])   . S/P rotator cuff repair     Past Surgical History:  Procedure Laterality Date  . ABDOMINAL HYSTERECTOMY    . BREAST SURGERY     partial mastectomy  . CHOLECYSTECTOMY    . ROTATOR CUFF REPAIR       Current Outpatient Medications:  .  Black Cohosh 40 MG CAPS, Take 40 mg by mouth., Disp: , Rfl:  .  cholecalciferol (VITAMIN D) 1000 units tablet, Take 2,000 Units by mouth 2 (two) times daily., Disp: , Rfl:  .  ibuprofen (ADVIL,MOTRIN) 800 MG tablet, Take 800 mg by mouth every 8 (eight) hours as needed., Disp: , Rfl:  .  nabumetone (RELAFEN) 500 MG tablet, Take 500 mg by mouth daily., Disp: , Rfl:  .  nitroGLYCERIN (NITRODUR - DOSED IN MG/24 HR) 0.2 mg/hr patch, Apply 1/4 patch daily to tendon for tendonitis., Disp: 30 patch, Rfl: 1 .  Omega-3 Fatty Acids (FISH OIL) 1000 MG CAPS, Take 1,000 mg by mouth., Disp: , Rfl:  .  ondansetron (ZOFRAN ODT) 4 MG disintegrating tablet, Take 1 tablet (4 mg total) by mouth every 8 (eight) hours as needed for nausea or vomiting., Disp: 20 tablet, Rfl: 0 .  predniSONE (DELTASONE) 10 MG tablet, Take 10 mg by mouth daily., Disp: , Rfl:   No Known  Allergies       Objective:  Physical Exam  General: AAO x3, NAD  Dermatological: Skin is warm, dry and supple bilateral. Nails x 10 are well manicured; remaining integument appears unremarkable at this time. There are no open sores, no preulcerative lesions, no rash or signs of infection present.  Vascular: Dorsalis Pedis artery and Posterior Tibial artery pedal pulses are 2/4 bilateral with immedate capillary fill time. There is no pain with calf compression, swelling, warmth, erythema.   Neruologic: Grossly intact via light touch bilateral. Sensation intact with SWMF. Negative tinel sign.   Musculoskeletal: Tenderness to palpation along the plantar medial tubercle of the calcaneus at the insertion of plantar fascia on the right foot. There is no pain along the course of the plantar fascia within the arch of the foot. Plantar fascia appears to be intact. There is no pain with lateral compression of the calcaneus or pain with vibratory sensation. There is no pain along the course or insertion of the achilles tendon. No other areas of tenderness to bilateral lower extremities. No significant equinus is present. Muscular strength 5/5 in all groups tested bilateral.  Gait: Unassisted, Nonantalgic.       Assessment:   Right chronic heel pain; plantar fasciitis    Plan:  -Treatment options discussed including all alternatives, risks, and complications -  Etiology of symptoms were discussed -I independently reviewed reviewed the MRI. X-rays were obtained and reviewed. No evidence of acute fracture. Mild calcaneal spur is present.  -We discussed both conservative and surgical options. She has tried numerous conservative options without relief. We did discuss today other options including formal PT, EPAT, custom inserts. At this time she wants to proceed with surgical intervention.  -Discussed EPF with PRP injection. She wants to proceed with this.  -The incision placement as well as the  postoperative course was discussed with the patient. I discussed risks of the surgery which include, but not limited to, infection, bleeding, pain, swelling, need for further surgery, delayed or nonhealing, painful or ugly scar, numbness or sensation changes, over/under correction, recurrence, transfer lesions, further deformity, hardware failure, DVT/PE, loss of toe/foot. Patient understands these risks and wishes to proceed with surgery. The surgical consent was reviewed with the patient all 3 pages were signed. No promises or guarantees were given to the outcome of the procedure. All questions were answered to the best of my ability. Before the surgery the patient was encouraged to call the office if there is any further questions. The surgery will be performed at the [facility] on an outpatient basis.  No follow-ups on file.  Trula Slade DPM

## 2019-06-24 ENCOUNTER — Other Ambulatory Visit: Payer: Self-pay | Admitting: Podiatry

## 2019-06-24 ENCOUNTER — Encounter: Payer: Self-pay | Admitting: Podiatry

## 2019-06-24 DIAGNOSIS — M722 Plantar fascial fibromatosis: Secondary | ICD-10-CM

## 2019-06-24 MED ORDER — PROMETHAZINE HCL 25 MG PO TABS: 25.0000 mg | ORAL_TABLET | Freq: Four times a day (QID) | ORAL | 0 refills | Status: DC | PRN

## 2019-06-24 MED ORDER — OXYCODONE-ACETAMINOPHEN 5-325 MG PO TABS: 1.0000 | ORAL_TABLET | Freq: Four times a day (QID) | ORAL | 0 refills | Status: DC | PRN

## 2019-06-24 MED ORDER — CEPHALEXIN 500 MG PO CAPS: 500.0000 mg | ORAL_CAPSULE | Freq: Three times a day (TID) | ORAL | 0 refills | Status: DC

## 2019-06-24 NOTE — Progress Notes (Signed)
Postop medications sent 

## 2019-06-29 ENCOUNTER — Other Ambulatory Visit: Payer: Self-pay

## 2019-06-29 ENCOUNTER — Ambulatory Visit (INDEPENDENT_AMBULATORY_CARE_PROVIDER_SITE_OTHER): Payer: 59 | Admitting: Podiatry

## 2019-06-29 DIAGNOSIS — M722 Plantar fascial fibromatosis: Secondary | ICD-10-CM

## 2019-07-03 ENCOUNTER — Telehealth: Payer: Self-pay | Admitting: *Deleted

## 2019-07-03 NOTE — Telephone Encounter (Signed)
Called patient and stated that I was calling to see how patient was doing after having surgery with Dr Jacqualyn Posey and patient stated that she was fine and that there was no fever or nausea and no chills and was elevating and icing as instructed and the patient stated that she has had no pain medicine at all and the nerve block was wearing off and I stated to call the office if any concerns or questions. Lattie Haw

## 2019-07-04 NOTE — Progress Notes (Signed)
Subjective: [name] is a 64 y.o. is seen today in office s/p right endoscopic plantar fascial release with steroid injection.  She states that she is doing well she is not having any significant pain not taking any pain medication.  She continues in the cam boot and crutches intensely off of her foot.  She can continue ice elevation she has no concerns. Denies any systemic complaints such as fevers, chills, nausea, vomiting. No calf pain, chest pain, shortness of breath.   Objective: General: No acute distress, AAOx3  DP/PT pulses palpable 2/4, CRT < 3 sec to all digits.  Protective sensation intact. Motor function intact.  RIGHT foot: Incision is well coapted without any evidence of dehiscence with sutures intact. There is no surrounding erythema, ascending cellulitis, fluctuance, crepitus, malodor, drainage/purulence. There is minimal edema around the surgical site. There is mild pain along the surgical site.  No other areas of tenderness to bilateral lower extremities.  No other open lesions or pre-ulcerative lesions.  No pain with calf compression, swelling, warmth, erythema.   Assessment and Plan:  Status post right endoscopic plantar fascial release, doing well with no complications   -Treatment options discussed including all alternatives, risks, and complications -Antibiotic ointment and a bandage applied to the incision.  Keep the dressing clean, dry, intact but if she is to change the bandage she can apply a Band-Aid and antibiotic ointment. -Continue a cam boot I want her to sleep in the boot  which she has not been doing. -Ice/elevation -Pain medication as needed. -As she starts to feel better she can transition to weightbearing as tolerated in the cam boot. -Monitor for any clinical signs or symptoms of infection and DVT/PE and directed to call the office immediately should any occur or go to the ER. -Follow-up as scheduled for likely suture removal or sooner if any  problems arise. In the meantime, encouraged to call the office with any questions, concerns, change in symptoms.   Celesta Gentile, DPM

## 2019-07-09 ENCOUNTER — Ambulatory Visit (INDEPENDENT_AMBULATORY_CARE_PROVIDER_SITE_OTHER): Payer: 59 | Admitting: Podiatry

## 2019-07-09 ENCOUNTER — Encounter: Payer: Self-pay | Admitting: Podiatry

## 2019-07-09 ENCOUNTER — Other Ambulatory Visit: Payer: Self-pay

## 2019-07-09 VITALS — Temp 97.3°F

## 2019-07-09 DIAGNOSIS — Z9889 Other specified postprocedural states: Secondary | ICD-10-CM

## 2019-07-09 DIAGNOSIS — M722 Plantar fascial fibromatosis: Secondary | ICD-10-CM

## 2019-07-09 NOTE — Progress Notes (Signed)
  Subjective:  Patient ID: [name], female    DOB: February 08, 1956,  MRN: [id number]  Chief Complaint  Patient presents with  . Routine Post Op    DOS 06/24/2019 R EPF. Pt stated, "Not painful - just sore. Changing dressing - keeping it clean and dry. No swelling/drainage". No fever/chills/N&V.    DOS: 06/24/19 Procedure: Endoscopic Plantar Fasciotomy right  64 y.o. female presents with the above complaint. History confirmed with patient.   Objective:  Physical Exam: tenderness at the surgical site, local edema noted and calf supple, nontender. Incision: healing well, no significant drainage, no dehiscence, no significant erythema  Assessment:   1. Plantar fasciitis   2. Post-operative state     Plan:  Patient was evaluated and treated and all questions answered.  Post-operative State -Sutures removed -Steri-strips applied to the incision -Ok to start showering at this time. Advised they cannot soak. -WBAT in Surgical shoe -Surgical shoe dispensed  -I advised should she have too much pain in the surgical shoe to go back into her boot. -Heel pad applied to the posterior aspect of the surgical shoe.  Return in about 2 weeks (around 07/23/2019) for Post-Op (No XRs).

## 2019-07-23 ENCOUNTER — Encounter: Payer: 59 | Admitting: Podiatry

## 2019-08-12 ENCOUNTER — Other Ambulatory Visit: Payer: Self-pay

## 2019-08-12 ENCOUNTER — Ambulatory Visit (INDEPENDENT_AMBULATORY_CARE_PROVIDER_SITE_OTHER): Payer: 59 | Admitting: Podiatry

## 2019-08-12 DIAGNOSIS — M722 Plantar fascial fibromatosis: Secondary | ICD-10-CM | POA: Diagnosis not present

## 2019-08-12 NOTE — Progress Notes (Signed)
Subjective: [name] is a 64 y.o. is seen today in office s/p right endoscopic plantar fascial release with steroid injection.  She states that she is feeling better and overall her pain is improving and feels much better the day prior to surgery she is back to wearing a regular shoe.  She states that she knows this can take time but it is improving.  She had to stop wearing the boot as her husband just recently had a stroke and she is on her feet more. Denies any systemic complaints such as fevers, chills, nausea, vomiting. No calf pain, chest pain, shortness of breath.   Objective: General: No acute distress, AAOx3  DP/PT pulses palpable 2/4, CRT < 3 sec to all digits.  Protective sensation intact. Motor function intact.  RIGHT foot: Incisions are well coapted.  There is slight tenderness along the plantar aspect of the heel and along the incision sites but overall appears to be improving she reports.  There is no pain with lateral compression of calcaneus.  No pain with Achilles tendon.  No there is a discomfort identified. No other open lesions or pre-ulcerative lesions.  No pain with calf compression, swelling, warmth, erythema.   Assessment and Plan:  Status post right endoscopic plantar fascial release, doing well with no complications   -Treatment options discussed including all alternatives, risks, and complications -She is back to wearing regular shoes.  I want her to work on stretching, rehab exercises and referral to physical therapy was written today.  Discussed wearing supportive shoes, inserts.  Ice and elevation still. -Proceed with custom inserts.  She was measured for these today.  Return in about 4 weeks (around 09/09/2019).  Trula Slade DPM

## 2019-08-14 ENCOUNTER — Telehealth: Payer: Self-pay | Admitting: *Deleted

## 2019-08-14 DIAGNOSIS — Z9889 Other specified postprocedural states: Secondary | ICD-10-CM

## 2019-08-14 DIAGNOSIS — M722 Plantar fascial fibromatosis: Secondary | ICD-10-CM

## 2019-08-14 DIAGNOSIS — M79671 Pain in right foot: Secondary | ICD-10-CM

## 2019-08-14 NOTE — Telephone Encounter (Signed)
Dr. Jacqualyn Posey states [facility] states [facility] is out of Network, refer to [facility]. Faxed referral to [facility].

## 2019-09-10 ENCOUNTER — Encounter: Payer: 59 | Admitting: Podiatry

## 2020-02-10 ENCOUNTER — Ambulatory Visit
Admission: EM | Admit: 2020-02-10 | Discharge: 2020-02-10 | Disposition: A | Discharge status: Home / Self Care | Payer: 59 | Attending: Emergency Medicine | Admitting: Emergency Medicine

## 2020-02-10 ENCOUNTER — Other Ambulatory Visit: Payer: Self-pay

## 2020-02-10 DIAGNOSIS — R1033 Periumbilical pain: Secondary | ICD-10-CM | POA: Insufficient documentation

## 2020-02-10 LAB — POCT URINALYSIS DIP (MANUAL ENTRY)
Bilirubin, UA: NEGATIVE
Blood, UA: NEGATIVE
Glucose, UA: NEGATIVE mg/dL
Ketones, POC UA: NEGATIVE mg/dL
Nitrite, UA: NEGATIVE
Spec Grav, UA: 1.025 (ref 1.010–1.025)
Urobilinogen, UA: 0.2 E.U./dL
pH, UA: 6 (ref 5.0–8.0)

## 2020-02-10 MED ORDER — ONDANSETRON 4 MG PO TBDP: 4.0000 mg | ORAL_TABLET | Freq: Three times a day (TID) | ORAL | 0 refills | Status: DC | PRN

## 2020-02-10 NOTE — ED Provider Notes (Signed)
[facility]    CSN: [id number] Arrival date & time: 02/10/20  [id number]      History   Chief Complaint Chief Complaint  Patient presents with  . Abdominal Pain  . Emesis    HPI [name] is a 64 y.o. female presenting today for evaluation of abdominal pain.  Patient has had abdominal pain and vomiting. Went to movies and cookout Monday evening, that next morning began to develop abdominal pain and vomiting. Pain is described as severe cramping. Denies diarrhea, changes in bowels. Associated headache. Took tylenol, pain improved some. Decreased appetite. Ate soup yesterday, pain slightly improved. Generalized weakness. History of cholecystectomy. Denies urinary symptoms.  HPI  Past Medical History:  Diagnosis Date  . Anemia   . Breast cancer ([place])   . S/P rotator cuff repair     Patient Active Problem List   Diagnosis Date Noted  . Plantar fasciitis of right foot 06/01/2019  . Achilles tendinitis, right leg 06/01/2019  . Breast cancer, right breast ([place]) 05/14/2011    Past Surgical History:  Procedure Laterality Date  . ABDOMINAL HYSTERECTOMY    . BREAST SURGERY     partial mastectomy  . CHOLECYSTECTOMY    . ROTATOR CUFF REPAIR      OB History    Gravida  4   Para  4   Term  4   Preterm  0   AB  0   Living  3     SAB  0   IAB  0   Ectopic  0   Multiple  0   Live Births  4            Home Medications    Prior to Admission medications   Medication Sig Start Date End Date Taking? Authorizing Provider  amLODipine (NORVASC) 5 MG tablet Take 5 mg by mouth daily.   Yes [provider]  ondansetron (ZOFRAN ODT) 4 MG disintegrating tablet Take 1 tablet (4 mg total) by mouth every 8 (eight) hours as needed for nausea or vomiting. 02/10/20  Yes Nashalie Sallis C, PA-C  Black Cohosh 40 MG CAPS Take 40 mg by mouth.    [provider]  cholecalciferol (VITAMIN D) 1000 units tablet Take 2,000 Units by mouth 2 (two)  times daily.    [provider]  ibuprofen (ADVIL,MOTRIN) 800 MG tablet Take 800 mg by mouth every 8 (eight) hours as needed.    [provider]  nabumetone (RELAFEN) 500 MG tablet Take 500 mg by mouth daily.    [provider]  nitroGLYCERIN (NITRODUR - DOSED IN MG/24 HR) 0.2 mg/hr patch Apply 1/4 patch daily to tendon for tendonitis. 04/24/19   Gregor Hams, MD  Omega-3 Fatty Acids (FISH OIL) 1000 MG CAPS Take 1,000 mg by mouth.    [provider]  promethazine (PHENERGAN) 25 MG tablet Take 1 tablet (25 mg total) by mouth every 6 (six) hours as needed for nausea or vomiting. 06/24/19   Trula Slade, DPM    Family History Family History  Problem Relation Age of Onset  . Hypertension Mother   . Heart disease Father     Social History Social History   Tobacco Use  . Smoking status: Never Smoker  . Smokeless tobacco: Never Used  Substance Use Topics  . Alcohol use: No  . Drug use: No     Allergies   Patient has no known allergies.   Review of Systems Review of Systems  Constitutional: Negative for activity change, appetite change, chills, fatigue and fever.  HENT: Negative for congestion, ear pain, rhinorrhea, sinus pressure, sore throat and trouble swallowing.   Eyes: Negative for discharge and redness.  Respiratory: Negative for cough, chest tightness and shortness of breath.   Cardiovascular: Negative for chest pain.  Gastrointestinal: Positive for abdominal pain, nausea and vomiting. Negative for diarrhea.  Musculoskeletal: Negative for myalgias.  Skin: Negative for rash.  Neurological: Negative for dizziness, light-headedness and headaches.     Physical Exam Triage Vital Signs ED Triage Vitals  Enc Vitals Group     BP      Pulse      Resp      Temp      Temp src      SpO2      Weight      Height      Head Circumference      Peak Flow      Pain Score      Pain Loc      Pain Edu?      Excl. in GC?    No data  found.  Updated Vital Signs BP 122/88 (BP Location: Left Arm)   Pulse 76   Temp 97.6 F (36.4 C) (Oral)   Resp 18   Ht 5\' 1"  (1.549 m)   Wt 175 lb (79.4 kg)   SpO2 96%   BMI 33.07 kg/m   Visual Acuity Right Eye Distance:   Left Eye Distance:   Bilateral Distance:    Right Eye Near:   Left Eye Near:    Bilateral Near:     Physical Exam Vitals and nursing note reviewed.  Constitutional:      Appearance: She is well-developed and well-nourished.     Comments: No acute distress  HENT:     Head: Normocephalic and atraumatic.     Nose: Nose normal.     Mouth/Throat:     Comments: Oral mucosa pink and moist, no tonsillar enlargement or exudate. Posterior pharynx patent and nonerythematous, no uvula deviation or swelling. Normal phonation. Eyes:     Conjunctiva/sclera: Conjunctivae normal.  Cardiovascular:     Rate and Rhythm: Normal rate and regular rhythm.  Pulmonary:     Effort: Pulmonary effort is normal. No respiratory distress.     Comments: Breathing comfortably at rest, CTABL, no wheezing, rales or other adventitious sounds auscultated Abdominal:     General: There is no distension.     Tenderness: There is abdominal tenderness.     Comments: Soft, nondistended, tender to palpation in periumbilical area, slightly more prominent right lower quadrant compared to left lower quadrant, negative Rovsing, negative McBurney's, negative rebound  Musculoskeletal:        General: Normal range of motion.     Cervical back: Neck supple.  Skin:    General: Skin is warm and dry.  Neurological:     Mental Status: She is alert and oriented to person, place, and time.  Psychiatric:        Mood and Affect: Mood and affect normal.      UC Treatments / Results  Labs (all labs ordered are listed, but only abnormal results are displayed) Labs Reviewed  POCT URINALYSIS DIP (MANUAL ENTRY) - Abnormal; Notable for the following components:      Result Value   Clarity, UA cloudy  (*)    Protein Ur, POC trace (*)    Leukocytes, UA Trace (*)    All other components  within normal limits  URINE CULTURE    EKG   Radiology No results found.  Procedures Procedures (including critical care time)  Medications Ordered in UC Medications - No data to display  Initial Impression / Assessment and Plan / UC Course  I have reviewed the triage vital signs and the nursing notes.  Pertinent labs & imaging results that were available during my care of the patient were reviewed by me and considered in my medical decision making (see chart for details).     UA with only trace leuks, negative hemoglobin, low suspicion of UTI or underlying stone. Treating as viral gastroenteritis symptomatically and supportively. Negative rebound and no peritoneal signs, recommending close monitoring and if pain worsening again or moving to right lower quadrant to follow-up in emergency room to rule out appendicitis.  Discussed strict return precautions. Patient verbalized understanding and is agreeable with plan.  Final Clinical Impressions(s) / UC Diagnoses   Final diagnoses:  Periumbilical abdominal pain     Discharge Instructions     Urine relatively normal, no significant signs of infection or signs of a stone Use Zofran as needed For nausea and vomiting Continue Tylenol for pain Monitor for gradual resolution Follow-up in emergency room if developing worsening pain or moving to right lower abdomen.    ED Prescriptions    Medication Sig Dispense Auth. Provider   ondansetron (ZOFRAN ODT) 4 MG disintegrating tablet Take 1 tablet (4 mg total) by mouth every 8 (eight) hours as needed for nausea or vomiting. 20 tablet Jaquay Morneault, [place] C, PA-C     PDMP not reviewed this encounter.   Janith Lima, PA-C 02/10/20 1203

## 2020-02-10 NOTE — Discharge Instructions (Addendum)
Urine relatively normal, no significant signs of infection or signs of a stone Use Zofran as needed For nausea and vomiting Continue Tylenol for pain Monitor for gradual resolution Follow-up in emergency room if developing worsening pain or moving to right lower abdomen.

## 2020-02-10 NOTE — ED Triage Notes (Signed)
Pt presents to Urgent Care with c/o abdominal pain, vomiting, back pain, and headache since Tuesday morning.

## 2020-02-11 ENCOUNTER — Encounter (HOSPITAL_COMMUNITY): Payer: Self-pay | Admitting: Emergency Medicine

## 2020-02-11 ENCOUNTER — Other Ambulatory Visit: Payer: Self-pay

## 2020-02-11 ENCOUNTER — Emergency Department (HOSPITAL_COMMUNITY)
Admission: EM | Admit: 2020-02-11 | Discharge: 2020-02-12 | Disposition: A | Discharge status: Home / Self Care | Payer: 59 | Attending: Emergency Medicine | Admitting: Emergency Medicine

## 2020-02-11 DIAGNOSIS — R112 Nausea with vomiting, unspecified: Secondary | ICD-10-CM | POA: Diagnosis not present

## 2020-02-11 DIAGNOSIS — R1031 Right lower quadrant pain: Secondary | ICD-10-CM | POA: Diagnosis not present

## 2020-02-11 DIAGNOSIS — Z853 Personal history of malignant neoplasm of breast: Secondary | ICD-10-CM | POA: Insufficient documentation

## 2020-02-11 LAB — CBC
HCT: 41.8 % (ref 36.0–46.0)
Hemoglobin: 12.5 g/dL (ref 12.0–15.0)
MCH: 24.2 pg — ABNORMAL LOW (ref 26.0–34.0)
MCHC: 29.9 g/dL — ABNORMAL LOW (ref 30.0–36.0)
MCV: 80.9 fL (ref 80.0–100.0)
Platelets: 156 10*3/uL (ref 150–400)
RBC: 5.17 MIL/uL — ABNORMAL HIGH (ref 3.87–5.11)
RDW: 17.2 % — ABNORMAL HIGH (ref 11.5–15.5)
WBC: 5.2 10*3/uL (ref 4.0–10.5)
nRBC: 0 % (ref 0.0–0.2)

## 2020-02-11 LAB — URINALYSIS, ROUTINE W REFLEX MICROSCOPIC
Bilirubin Urine: NEGATIVE
Glucose, UA: NEGATIVE mg/dL
Hgb urine dipstick: NEGATIVE
Ketones, ur: NEGATIVE mg/dL
Leukocytes,Ua: NEGATIVE
Nitrite: NEGATIVE
Protein, ur: NEGATIVE mg/dL
Specific Gravity, Urine: 1.017 (ref 1.005–1.030)
pH: 6 (ref 5.0–8.0)

## 2020-02-11 LAB — COMPREHENSIVE METABOLIC PANEL
ALT: 20 U/L (ref 0–44)
AST: 32 U/L (ref 15–41)
Albumin: 3.9 g/dL (ref 3.5–5.0)
Alkaline Phosphatase: 45 U/L (ref 38–126)
Anion gap: 8 (ref 5–15)
BUN: 8 mg/dL (ref 8–23)
CO2: 25 mmol/L (ref 22–32)
Calcium: 9.6 mg/dL (ref 8.9–10.3)
Chloride: 107 mmol/L (ref 98–111)
Creatinine, Ser: 0.83 mg/dL (ref 0.44–1.00)
GFR, Estimated: >60 mL/min (ref >60)
Glucose, Bld: 109 mg/dL — ABNORMAL HIGH (ref 70–99)
Potassium: 3.9 mmol/L (ref 3.5–5.1)
Sodium: 140 mmol/L (ref 135–145)
Total Bilirubin: 0.7 mg/dL (ref 0.3–1.2)
Total Protein: 7.6 g/dL (ref 6.5–8.1)

## 2020-02-11 LAB — LIPASE, BLOOD: Lipase: 21 U/L (ref 11–51)

## 2020-02-11 MED ORDER — SODIUM CHLORIDE 0.9 % IV BOLUS (SEPSIS)
1000.0000 mL | Freq: Once | INTRAVENOUS | Status: AC
Start: 2020-02-12 — End: 2020-02-12
  Administered 2020-02-12: 1000 mL via INTRAVENOUS

## 2020-02-11 MED ORDER — MORPHINE SULFATE (PF) 4 MG/ML IV SOLN
4.0000 mg | Freq: Once | INTRAVENOUS | Status: AC
  Administered 2020-02-12: 4 mg via INTRAVENOUS
  Filled 2020-02-11: qty 1

## 2020-02-11 MED ORDER — ONDANSETRON HCL 4 MG/2ML IJ SOLN
4.0000 mg | Freq: Once | INTRAMUSCULAR | Status: AC
  Administered 2020-02-12: 4 mg via INTRAVENOUS
  Filled 2020-02-11: qty 2

## 2020-02-11 NOTE — ED Provider Notes (Signed)
TIME SEEN: 11:49 PM  CHIEF COMPLAINT: Right lower quadrant abdominal pain, vomiting  HPI: Patient is a 64 year old female with history of previous breast cancer who presents to the emergency department with complaints of nausea, vomiting and sharp, cramping right lower quadrant abdominal pain that started at 2:30 AM on Tuesday, December 28.  He states that the previous day she went to the movies with her grandson and ate popcorn and then that night had fries from cookout.  She states she is concerned she could have food poisoning.  No diarrhea.  Having normal bowel movements without blood or melena.  No dysuria, hematuria, vaginal bleeding or discharge.  Status post cholecystectomy, hysterectomy and bilateral salpingo-oophorectomy.  States has had decreased appetite.  No known sick contacts.  Seen in urgent care for same yesterday.  Continue to have right lower quadrant pain so presented to the ED.  ROS: See HPI Constitutional: no fever  Eyes: no drainage  ENT: no runny nose   Cardiovascular:  no chest pain  Resp: no SOB  GI:  vomiting GU: no dysuria Integumentary: no rash  Allergy: no hives  Musculoskeletal: no leg swelling  Neurological: no slurred speech ROS otherwise negative  PAST MEDICAL HISTORY/PAST SURGICAL HISTORY:  Past Medical History:  Diagnosis Date  . Anemia   . Breast cancer (HCC)   . S/P rotator cuff repair     MEDICATIONS:  Prior to Admission medications   Medication Sig Start Date End Date Taking? Authorizing Provider  amLODipine (NORVASC) 5 MG tablet Take 5 mg by mouth daily.    [provider]  Black Cohosh 40 MG CAPS Take 40 mg by mouth.    [provider]  cholecalciferol (VITAMIN D) 1000 units tablet Take 2,000 Units by mouth 2 (two) times daily.    [provider]  ibuprofen (ADVIL,MOTRIN) 800 MG tablet Take 800 mg by mouth every 8 (eight) hours as needed.    [provider]  nabumetone (RELAFEN) 500 MG tablet Take 500 mg  by mouth daily.    [provider]  nitroGLYCERIN (NITRODUR - DOSED IN MG/24 HR) 0.2 mg/hr patch Apply 1/4 patch daily to tendon for tendonitis. 04/24/19   Rodolph Bong, MD  Omega-3 Fatty Acids (FISH OIL) 1000 MG CAPS Take 1,000 mg by mouth.    [provider]  ondansetron (ZOFRAN ODT) 4 MG disintegrating tablet Take 1 tablet (4 mg total) by mouth every 8 (eight) hours as needed for nausea or vomiting. 02/10/20   Wieters, Hallie C, PA-C  promethazine (PHENERGAN) 25 MG tablet Take 1 tablet (25 mg total) by mouth every 6 (six) hours as needed for nausea or vomiting. 06/24/19   Vivi Barrack, DPM    ALLERGIES:  No Known Allergies  SOCIAL HISTORY:  Social History   Tobacco Use  . Smoking status: Never Smoker  . Smokeless tobacco: Never Used  Substance Use Topics  . Alcohol use: No    FAMILY HISTORY: Family History  Problem Relation Age of Onset  . Hypertension Mother   . Heart disease Father     EXAM: BP (!) 155/107 (BP Location: Left Arm)   Pulse 84   Temp 97.9 F (36.6 C) (Oral)   Resp 17   SpO2 99%  CONSTITUTIONAL: Alert and oriented and responds appropriately to questions. Well-appearing; well-nourished HEAD: Normocephalic EYES: Conjunctivae clear, pupils appear equal, EOM appear intact ENT: normal nose; moist mucous membranes NECK: Supple, normal ROM CARD: RRR; S1 and S2 appreciated; no murmurs, no  clicks, no rubs, no gallops RESP: Normal chest excursion without splinting or tachypnea; breath sounds clear and equal bilaterally; no wheezes, no rhonchi, no rales, no hypoxia or respiratory distress, speaking full sentences ABD/GI: Normal bowel sounds; non-distended; soft, tender at McBurney's point without guarding or rebound, no peritoneal signs BACK:  The back appears normal EXT: Normal ROM in all joints; no deformity noted, no edema; no cyanosis SKIN: Normal color for age and race; warm; no rash on exposed skin NEURO: Moves all extremities  equally PSYCH: The patient's mood and manner are appropriate.   MEDICAL DECISION MAKING: Patient here with abdominal pain, vomiting.  Labs are reassuring.  No leukocytosis.  Urine shows no sign of infection or blood.  LFTs, lipase normal.  Creatinine normal.  Will obtain CT of the abdomen pelvis to evaluate for possible appendicitis.  Differential also includes viral gastroenteritis, diverticulitis, gastritis, colitis, SBO.  Will give IV fluids, pain and nausea medicine.  ED PROGRESS: Patient reports feeling better.  Tolerating p.o.  CT scan shows no acute intra-abdominal pelvic pathology.  Appendix appears normal.  She has Zofran for home prescribed by urgent care.  Will discharge with Bentyl.  Suspect viral illness.  Discussed return precautions.  At this time, I do not feel there is any life-threatening condition present. I have reviewed, interpreted and discussed all results (EKG, imaging, lab, urine as appropriate) and exam findings with patient/family. I have reviewed nursing notes and appropriate previous records.  I feel the patient is safe to be discharged home without further emergent workup and can continue workup as an outpatient as needed. Discussed usual and customary return precautions. Patient/family verbalize understanding and are comfortable with this plan.  Outpatient follow-up has been provided as needed. All questions have been answered.     [name] was evaluated in Emergency Department on 02/11/2020 for the symptoms described in the history of present illness. She was evaluated in the context of the global COVID-19 pandemic, which necessitated consideration that the patient might be at risk for infection with the SARS-CoV-2 virus that causes COVID-19. Institutional protocols and algorithms that pertain to the evaluation of patients at risk for COVID-19 are in a state of rapid change based on information released by regulatory bodies including the CDC and federal and state  organizations. These policies and algorithms were followed during the patient's care in the ED.      [name], Delice Bison, DO 02/12/20 [phone]

## 2020-02-11 NOTE — ED Triage Notes (Signed)
Pt states she has had RLQ abdominal pain, N/V and diarrhea x 2 days. States she ate fries from cookout Monday and wonders if she has food poisoning. Alert and oriented.

## 2020-02-12 ENCOUNTER — Emergency Department (HOSPITAL_COMMUNITY): Payer: 59

## 2020-02-12 ENCOUNTER — Telehealth (HOSPITAL_COMMUNITY): Payer: Self-pay | Admitting: Emergency Medicine

## 2020-02-12 LAB — URINE CULTURE: Culture: >=100000 — AB

## 2020-02-12 MED ORDER — DICYCLOMINE HCL 20 MG PO TABS: 20.0000 mg | ORAL_TABLET | Freq: Three times a day (TID) | ORAL | 0 refills | Status: DC | PRN

## 2020-02-12 MED ORDER — IOHEXOL 300 MG/ML  SOLN
100.0000 mL | Freq: Once | INTRAMUSCULAR | Status: AC | PRN
  Administered 2020-02-12: 100 mL via INTRAVENOUS

## 2020-02-12 MED ORDER — NITROFURANTOIN MONOHYD MACRO 100 MG PO CAPS: 100.0000 mg | ORAL_CAPSULE | Freq: Two times a day (BID) | ORAL | 0 refills | Status: DC

## 2020-02-12 NOTE — Telephone Encounter (Signed)
Per protocol, patient will need treatment with Macrobid.   Attempted to reach patient x 1, prescription sent to pharmacy on file

## 2020-02-12 NOTE — Discharge Instructions (Addendum)
Your labs, urine and CT scan today were normal.  Your appendix appeared normal.  I suspect you have a viral illness causing your symptoms today.  You may take Zofran as needed for nausea and vomiting and Bentyl as needed for abdominal cramping.  You may alternate Tylenol 1000 mg every 6 hours as needed for pain, fever and Ibuprofen 800 mg every 8 hours as needed for pain, fever.  Please take Ibuprofen with food.  Do not take more than 4000 mg of Tylenol (acetaminophen) in a 24 hour period.

## 2022-05-30 ENCOUNTER — Telehealth: Payer: Self-pay

## 2022-05-30 ENCOUNTER — Encounter: Payer: Self-pay | Admitting: Physician Assistant

## 2022-05-30 ENCOUNTER — Other Ambulatory Visit (INDEPENDENT_AMBULATORY_CARE_PROVIDER_SITE_OTHER): Payer: Medicare Other

## 2022-05-30 ENCOUNTER — Ambulatory Visit (INDEPENDENT_AMBULATORY_CARE_PROVIDER_SITE_OTHER): Payer: Medicare Other | Admitting: Physician Assistant

## 2022-05-30 VITALS — Ht 59.0 in | Wt 199.0 lb

## 2022-05-30 DIAGNOSIS — M25561 Pain in right knee: Secondary | ICD-10-CM

## 2022-05-30 DIAGNOSIS — G8929 Other chronic pain: Secondary | ICD-10-CM | POA: Diagnosis not present

## 2022-05-30 DIAGNOSIS — M25562 Pain in left knee: Secondary | ICD-10-CM

## 2022-05-30 DIAGNOSIS — M170 Bilateral primary osteoarthritis of knee: Secondary | ICD-10-CM | POA: Diagnosis not present

## 2022-05-30 MED ORDER — MELOXICAM 7.5 MG PO TABS
7.5000 mg | ORAL_TABLET | Freq: Every day | ORAL | 0 refills | Status: DC
Start: 2022-05-30 — End: 2022-09-12

## 2022-05-30 NOTE — Progress Notes (Signed)
Office Visit Note   Patient: [name]           Date of Birth: 1955/03/06           MRN: [id number] Visit Date: 05/30/2022              Requested by: Catherine Davenport, MD [address] [place],  [place] [address] PCP: Catherine Davenport, MD   Assessment & Plan: Visit Diagnoses:  1. Chronic pain of both knees   2. Primary osteoarthritis of both knees     Plan: Ms. [name] is a pleasant 67 year old woman with a 1 month history of right greater than left knee pain which she describes as moderate.  She has had problems with her knee in the past.  She has gotten steroid injections which were not very helpful.  She did get Synvisc injections a few years ago and they lasted her quite a while she has had no new injury no fever or chills she notices pain especially going down the stairs.  X-rays show early degenerative changes.  Since she has been helped significantly with viscosupplementation in the past I will go forward and order this for her for her bilateral knees.  In the meantime I talked her about Voltaren gel and I will prescribe her some meloxicam.  She denies any difficulty with ibuprofen or other anti-inflammatories This patient is diagnosed with osteoarthritis of the knee(s).    Radiographs show evidence of joint space narrowing, osteophytes, subchondral sclerosis and/or subchondral cysts.  This patient has knee pain which interferes with functional and activities of daily living.    This patient has experienced inadequate response, adverse effects and/or intolerance with conservative treatments such as acetaminophen, NSAIDS, topical creams, physical therapy or regular exercise, knee bracing and/or weight loss.   This patient has experienced inadequate response or has a contraindication to intra articular steroid injections for at least 3 months.   This patient is not scheduled to have a total knee replacement within 6 months of starting treatment with  viscosupplementation.  Follow-Up Instructions:  viscosupplementation Orders:  Orders Placed This Encounter  Procedures   XR KNEE 3 VIEW LEFT   XR KNEE 3 VIEW RIGHT   No orders of the defined types were placed in this encounter.     Procedures: No procedures performed   Clinical Data: No additional findings.   Subjective: Chief Complaint  Patient presents with   Right Knee - Pain   Left Knee - Pain    HPI patient is a pleasant 67 year old woman with a history of right greater than left knee pain.  She said in the past she has gotten steroid injections which have not really helped her but did get quite a relief from Synvisc injections.  She has had no new injury no fever no chills.  She rates her pain as moderate  Review of Systems  All other systems reviewed and are negative.    Objective: Vital Signs: Ht  (1.499 m)   Wt 199 lb (90.3 kg)   BMI 40.19 kg/m   Physical Exam Constitutional:      Appearance: Normal appearance.  Pulmonary:     Effort: Pulmonary effort is normal.  Skin:    General: Skin is warm and dry.  Neurological:     General: No focal deficit present.     Mental Status: She is alert.  Psychiatric:        Mood and Affect: Mood normal.  Ortho Exam Bilateral knees she is neurovascular intact and her compartments bilaterally are soft and nontender she has good strength in her lower extremities.  She has no effusion no erythema.  She is got very good varus valgus stability.  On the right knee she seems to have more pain with compression over the patella less over the joint lines on the left knee a little bit more along the joint line  Specialty Comments:  No specialty comments available.  Imaging: XR KNEE 3 VIEW LEFT  Result Date: 05/30/2022 Three-view x-rays of the left knee were reviewed today well-maintained alignment overall well-preserved joint spacing with some early sclerotic changes  XR KNEE 3 VIEW RIGHT  Result Date:  05/30/2022 Three-view radiographs of the right knee and knee were reviewed today overall well-preserved joint spacing she does have some early sclerotic changes but no significant periarticular osteophytes no acute fractures    PMFS History: Patient Active Problem List   Diagnosis Date Noted   Osteoarthritis of knees, bilateral 05/30/2022   Plantar fasciitis of right foot 06/01/2019   Achilles tendinitis, right leg 06/01/2019   Breast cancer, right breast 05/14/2011   Past Medical History:  Diagnosis Date   Anemia    Breast cancer    S/P rotator cuff repair     Family History  Problem Relation Age of Onset   Hypertension Mother    Heart disease Father     Past Surgical History:  Procedure Laterality Date   ABDOMINAL HYSTERECTOMY     BREAST SURGERY     partial mastectomy   CHOLECYSTECTOMY     ROTATOR CUFF REPAIR     Social History   Occupational History   Not on file  Tobacco Use   Smoking status: Never   Smokeless tobacco: Never  Substance and Sexual Activity   Alcohol use: No   Drug use: No   Sexual activity: Not on file

## 2022-05-30 NOTE — Telephone Encounter (Signed)
Please precert for bilateral gel injection. [facility] patient.

## 2022-05-31 NOTE — Telephone Encounter (Signed)
VOB submitted for Durolane, bilateral knee  

## 2022-06-06 ENCOUNTER — Telehealth: Payer: Self-pay | Admitting: Physician Assistant

## 2022-06-06 ENCOUNTER — Other Ambulatory Visit: Payer: Self-pay

## 2022-06-06 DIAGNOSIS — M170 Bilateral primary osteoarthritis of knee: Secondary | ICD-10-CM

## 2022-06-06 NOTE — Telephone Encounter (Signed)
Talked with patient and appointment has been scheduled.  

## 2022-06-06 NOTE — Telephone Encounter (Signed)
Patient called. She would like to know the status of the gel injections. Her call back number is [phone]

## 2022-06-07 ENCOUNTER — Encounter: Payer: Self-pay | Admitting: Physician Assistant

## 2022-06-07 ENCOUNTER — Ambulatory Visit: Payer: Medicare Other | Admitting: Physician Assistant

## 2022-06-07 DIAGNOSIS — M1711 Unilateral primary osteoarthritis, right knee: Secondary | ICD-10-CM

## 2022-06-07 DIAGNOSIS — M170 Bilateral primary osteoarthritis of knee: Secondary | ICD-10-CM

## 2022-06-07 DIAGNOSIS — M1712 Unilateral primary osteoarthritis, left knee: Secondary | ICD-10-CM | POA: Diagnosis not present

## 2022-06-07 MED ORDER — SODIUM HYALURONATE 60 MG/3ML IX PRSY
60.0000 mg | PREFILLED_SYRINGE | INTRA_ARTICULAR | Status: AC | PRN
Start: 2022-06-07 — End: 2022-06-07
  Administered 2022-06-07: 60 mg via INTRA_ARTICULAR

## 2022-06-07 NOTE — Progress Notes (Signed)
Office Visit Note   Patient: [name]           Date of Birth: November 12, 1955           MRN: [id number] Visit Date: 06/07/2022              Requested by: Dois Davenport, MD [address] [place],  [place] [address] PCP: Dois Davenport, MD  Chief Complaint  Patient presents with  . Right Knee - Follow-up    Durolane  . Left Knee - Follow-up    Durolane      HPI: Ms. Heney comes in today for bilateral Durolane injections into her knees.  She has a history of osteoarthritis.  She has had she thinks Synvisc injections in the past denies any reactions.  Assessment & Plan: Visit Diagnoses:  1. Primary osteoarthritis of both knees     Plan: Injections were given without any difficulty she may follow-up as needed possible side effects were discussed with her  Follow-Up Instructions: Return if symptoms worsen or fail to improve.   Ortho Exam  Patient is alert, oriented, no adenopathy, well-dressed, normal affect, normal respiratory effort. Bilateral knees no effusion no erythema compartments are soft and nontender she is neurovascular intact  Imaging: No results found. No images are attached to the encounter.  Labs: Lab Results  Component Value Date   REPTSTATUS 02/12/2020 FINAL 02/10/2020   CULT >=100,000 COLONIES/mL ESCHERICHIA COLI (A) 02/10/2020   LABORGA ESCHERICHIA COLI (A) 02/10/2020     Lab Results  Component Value Date   ALBUMIN 3.9 02/11/2020   ALBUMIN 3.4 (L) 06/21/2017   ALBUMIN 4.1 03/09/2013    No results found for: "MG" Lab Results  Component Value Date   VD25OH 21 (L) 11/29/2011   VD25OH 23 (L) 11/23/2010   VD25OH 16 (L) 05/10/2010    No results found for: "PREALBUMIN"    Latest Ref Rng & Units 02/11/2020   12:01 PM 06/21/2017    8:17 PM 06/21/2017    1:52 AM  CBC EXTENDED  WBC 4.0 - 10.5 K/uL 5.2  6.1  7.1   RBC 3.87 - 5.11 MIL/uL 5.17  5.03  4.70   Hemoglobin 12.0 - 15.0 g/dL 19.1  47.8  29.5   HCT 36.0 - 46.0 %  41.8  38.4  36.8   Platelets 150 - 400 K/uL 156  205  197      There is no height or weight on file to calculate BMI.  Orders:  No orders of the defined types were placed in this encounter.  No orders of the defined types were placed in this encounter.    Procedures: Large Joint Inj: bilateral knee on 06/07/2022 2:54 PM Indications: pain and diagnostic evaluation Details: 1.5 in  Arthrogram: No  Medications (Right): 60 mg Sodium Hyaluronate 60 MG/3ML Medications (Left): 60 mg Sodium Hyaluronate 60 MG/3ML Outcome: tolerated well, no immediate complications Procedure, treatment alternatives, risks and benefits explained, specific risks discussed. Consent was given by the patient.    Clinical Data: No additional findings.  ROS:  All other systems negative, except as noted in the HPI. Review of Systems  Objective: Vital Signs: There were no vitals taken for this visit.  Specialty Comments:  No specialty comments available.  PMFS History: Patient Active Problem List   Diagnosis Date Noted  . Osteoarthritis of knees, bilateral 05/30/2022  . Plantar fasciitis of right foot 06/01/2019  . Achilles tendinitis, right leg 06/01/2019  . Breast cancer,  right breast 05/14/2011   Past Medical History:  Diagnosis Date  . Anemia   . Breast cancer   . S/P rotator cuff repair     Family History  Problem Relation Age of Onset  . Hypertension Mother   . Heart disease Father     Past Surgical History:  Procedure Laterality Date  . ABDOMINAL HYSTERECTOMY    . BREAST SURGERY     partial mastectomy  . CHOLECYSTECTOMY    . ROTATOR CUFF REPAIR     Social History   Occupational History  . Not on file  Tobacco Use  . Smoking status: Never  . Smokeless tobacco: Never  Substance and Sexual Activity  . Alcohol use: No  . Drug use: No  . Sexual activity: Not on file

## 2022-06-08 ENCOUNTER — Ambulatory Visit: Payer: Medicare Other | Admitting: Physician Assistant

## 2022-09-12 ENCOUNTER — Ambulatory Visit (INDEPENDENT_AMBULATORY_CARE_PROVIDER_SITE_OTHER): Payer: 59

## 2022-09-12 ENCOUNTER — Other Ambulatory Visit: Payer: Self-pay | Admitting: Podiatry

## 2022-09-12 ENCOUNTER — Encounter: Payer: Self-pay | Admitting: Podiatry

## 2022-09-12 ENCOUNTER — Ambulatory Visit (INDEPENDENT_AMBULATORY_CARE_PROVIDER_SITE_OTHER): Payer: 59 | Admitting: Podiatry

## 2022-09-12 DIAGNOSIS — M79672 Pain in left foot: Secondary | ICD-10-CM | POA: Diagnosis not present

## 2022-09-12 DIAGNOSIS — M722 Plantar fascial fibromatosis: Secondary | ICD-10-CM

## 2022-09-12 MED ORDER — DICLOFENAC SODIUM 75 MG PO TBEC
75.0000 mg | DELAYED_RELEASE_TABLET | Freq: Two times a day (BID) | ORAL | 2 refills | Status: DC
Start: 2022-09-12 — End: 2022-12-21

## 2022-09-12 MED ORDER — TRIAMCINOLONE ACETONIDE 10 MG/ML IJ SUSP
10.0000 mg | Freq: Once | INTRAMUSCULAR | Status: AC
Start: 2022-09-12 — End: 2022-09-12
  Administered 2022-09-12: 10 mg via INTRA_ARTICULAR

## 2022-09-12 NOTE — Patient Instructions (Signed)

## 2022-09-13 NOTE — Progress Notes (Signed)
Subjective:   Patient ID: [name], female   DOB: 67 y.o.   MRN: [id number]   HPI Patient presents stating that she is having a lot of pain underneath her left heel and it has been present for several months.  States she does not remember injury she has tried soaks she has tried shoe gear modifications does not smoke likes to be active   Review of Systems  All other systems reviewed and are negative.       Objective:  Physical Exam Vitals and nursing note reviewed.  Constitutional:      Appearance: She is well-developed.  Pulmonary:     Effort: Pulmonary effort is normal.  Musculoskeletal:        General: Normal range of motion.  Skin:    General: Skin is warm.  Neurological:     Mental Status: She is alert.     Neurovascular status intact muscle strength adequate range of motion adequate exquisite discomfort plantar aspect left heel at the insertional point tendon calcaneus moderate depression of the arch good digital perfusion well-oriented     Assessment:  Acute plantar fasciitis left with inflammation fluid medial band moderate depression of the arch     Plan:  H&P reviewed went ahead today reviewed x-rays sterile prep injected the plantar fascia left 3 mg Kenalog 5 mg Xylocaine applied fascial taping to lift up the arch and take pressure off the fascia.  Patient will be seen back 2 to 3 weeks instructions on physical therapy shoe gear modification given to patient  X-rays indicate spur formation moderate depression of the arch no indication stress fracture

## 2022-09-14 ENCOUNTER — Ambulatory Visit: Payer: 59 | Admitting: Podiatry

## 2022-09-26 ENCOUNTER — Encounter: Payer: Self-pay | Admitting: Podiatry

## 2022-09-26 ENCOUNTER — Ambulatory Visit: Payer: 59 | Admitting: Podiatry

## 2022-09-26 DIAGNOSIS — M722 Plantar fascial fibromatosis: Secondary | ICD-10-CM

## 2022-09-26 MED ORDER — TRIAMCINOLONE ACETONIDE 10 MG/ML IJ SUSP
10.0000 mg | Freq: Once | INTRAMUSCULAR | Status: AC
Start: 2022-09-26 — End: 2022-09-26
  Administered 2022-09-26: 10 mg via INTRA_ARTICULAR

## 2022-09-26 NOTE — Progress Notes (Signed)
Subjective:   Patient ID: [name], female   DOB: 67 y.o.   MRN: [id number]   HPI Patient presents stating I am still having a lot of pain but it is more in the center and outside of the tendon of my heel.  States very hard to walk on and I did have surgery on my right 1   ROS      Objective:  Physical Exam  Neurovascular status intact muscle strength adequate range of motion adequate discomfort with inflammation fluid buildup of the center and lateral aspect of the plantar fascia at the insertional point tendon calcaneus with inability to bear significant plantar weight and was not able to take oral medication     Assessment:  Acute plantar fasciitis left that is not resolved with history of surgery right     Plan:  H&P reviewed from the lateral side since the pain is more central and lateral I did inject 3 mg Kenalog 5 mg Xylocaine and I applied a air fracture walker to take all pressure off the heel.  This was fitted properly to the lower leg and did reduce the weightbearing forces reappoint to recheck

## 2022-10-24 ENCOUNTER — Ambulatory Visit: Payer: 59 | Admitting: Podiatry

## 2022-10-31 ENCOUNTER — Encounter: Payer: Self-pay | Admitting: Podiatry

## 2022-10-31 ENCOUNTER — Ambulatory Visit (INDEPENDENT_AMBULATORY_CARE_PROVIDER_SITE_OTHER): Payer: 59 | Admitting: Podiatry

## 2022-10-31 DIAGNOSIS — M722 Plantar fascial fibromatosis: Secondary | ICD-10-CM

## 2022-10-31 NOTE — Progress Notes (Signed)
Subjective:   Patient ID: [name], female   DOB: 67 y.o.   MRN: [id number]   HPI Patient presents significant improvement with pain right stating that she still has mild discomfort but much better and has not been wearing the boot recently   ROS      Objective:  Physical Exam  Neurovascular status intact improved discomfort right heel with diminishment of discomfort     Assessment:  Plantar fasciitis improved reviewed the     Plan:  Continuation of boot as needed stretching exercises ice as needed I want her to increase exercise and we will see how it does

## 2022-11-02 ENCOUNTER — Ambulatory Visit (INDEPENDENT_AMBULATORY_CARE_PROVIDER_SITE_OTHER): Payer: 59 | Admitting: Physician Assistant

## 2022-11-02 ENCOUNTER — Encounter: Payer: Self-pay | Admitting: Physician Assistant

## 2022-11-02 ENCOUNTER — Telehealth: Payer: Self-pay | Admitting: Radiology

## 2022-11-02 VITALS — Ht 60.0 in | Wt 200.0 lb

## 2022-11-02 DIAGNOSIS — M170 Bilateral primary osteoarthritis of knee: Secondary | ICD-10-CM | POA: Diagnosis not present

## 2022-11-02 DIAGNOSIS — M1711 Unilateral primary osteoarthritis, right knee: Secondary | ICD-10-CM | POA: Diagnosis not present

## 2022-11-02 MED ORDER — METHYLPREDNISOLONE ACETATE 40 MG/ML IJ SUSP
80.0000 mg | INTRAMUSCULAR | Status: AC | PRN
Start: 2022-11-02 — End: 2022-11-02
  Administered 2022-11-02: 80 mg via INTRA_ARTICULAR

## 2022-11-02 MED ORDER — LIDOCAINE HCL 1 % IJ SOLN
2.0000 mL | INTRAMUSCULAR | Status: AC | PRN
Start: 2022-11-02 — End: 2022-11-02
  Administered 2022-11-02: 2 mL

## 2022-11-02 MED ORDER — BUPIVACAINE HCL 0.25 % IJ SOLN
2.0000 mL | INTRAMUSCULAR | Status: AC | PRN
Start: 2022-11-02 — End: 2022-11-02
  Administered 2022-11-02: 2 mL via INTRA_ARTICULAR

## 2022-11-02 NOTE — Telephone Encounter (Signed)
Please get a approval for Durolane in the right knee.

## 2022-11-02 NOTE — Progress Notes (Signed)
Office Visit Note   Patient: [name]           Date of Birth: 11/17/1955           MRN: [id number] Visit Date: 11/02/2022              Requested by: Dois Davenport, MD [address] [place],  [place] [address] PCP: Dois Davenport, MD  No chief complaint on file.     HPI: Patient is a pleasant 67 year old woman with a history of bilateral osteoarthritis of her knees right worse than left.  Presents today with right knee pain.  She has had steroid injections in the past which have not been helpful.  She has done better with viscosupplementation last injections being 5 months ago no new injury  Assessment & Plan: Visit Diagnoses:  1. Primary osteoarthritis of both knees     Plan: Osteoarthritis right knee.  Will go forward with a steroid injection today she would like to be reauthorized for viscosupplementation we will plan on this in October This patient is diagnosed with osteoarthritis of the knee(s).    Radiographs show evidence of joint space narrowing, osteophytes, subchondral sclerosis and/or subchondral cysts.  This patient has knee pain which interferes with functional and activities of daily living.    This patient has experienced inadequate response, adverse effects and/or intolerance with conservative treatments such as acetaminophen, NSAIDS, topical creams, physical therapy or regular exercise, knee bracing and/or weight loss.   This patient has experienced inadequate response or has a contraindication to intra articular steroid injections for at least 3 months.   This patient is not scheduled to have a total knee replacement within 6 months of starting treatment with viscosupplementation.   Follow-Up Instructions: No follow-ups on file.   Ortho Exam  Patient is alert, oriented, no adenopathy, well-dressed, normal affect, normal respiratory effort. Right knee no effusion no erythema compartments are soft and compressible she is neurovascular  intact she does have grinding with range of motion  Imaging: No results found. No images are attached to the encounter.  Labs: Lab Results  Component Value Date   REPTSTATUS 02/12/2020 FINAL 02/10/2020   CULT >=100,000 COLONIES/mL ESCHERICHIA COLI (A) 02/10/2020   LABORGA ESCHERICHIA COLI (A) 02/10/2020     Lab Results  Component Value Date   ALBUMIN 3.9 02/11/2020   ALBUMIN 3.4 (L) 06/21/2017   ALBUMIN 4.1 03/09/2013    No results found for: "MG" Lab Results  Component Value Date   VD25OH 21 (L) 11/29/2011   VD25OH 23 (L) 11/23/2010   VD25OH 16 (L) 05/10/2010    No results found for: "PREALBUMIN"    Latest Ref Rng & Units 02/11/2020   12:01 PM 06/21/2017    8:17 PM 06/21/2017    1:52 AM  CBC EXTENDED  WBC 4.0 - 10.5 K/uL 5.2  6.1  7.1   RBC 3.87 - 5.11 MIL/uL 5.17  5.03  4.70   Hemoglobin 12.0 - 15.0 g/dL 13.2  44.0  10.2   HCT 36.0 - 46.0 % 41.8  38.4  36.8   Platelets 150 - 400 K/uL 156  205  197      There is no height or weight on file to calculate BMI.  Orders:  No orders of the defined types were placed in this encounter.  No orders of the defined types were placed in this encounter.    Procedures: Large Joint Inj: R knee on 11/02/2022 3:54 PM Indications: pain  and diagnostic evaluation Details: 25 G 1.5 in needle, anteromedial approach  Arthrogram: No  Medications: 80 mg methylPREDNISolone acetate 40 MG/ML; 2 mL lidocaine 1 %; 2 mL bupivacaine 0.25 % Outcome: tolerated well, no immediate complications Procedure, treatment alternatives, risks and benefits explained, specific risks discussed. Consent was given by the patient.    Clinical Data: No additional findings.  ROS:  All other systems negative, except as noted in the HPI. Review of Systems  Objective: Vital Signs: There were no vitals taken for this visit.  Specialty Comments:  No specialty comments available.  PMFS History: Patient Active Problem List   Diagnosis Date Noted   . Osteoarthritis of knees, bilateral 05/30/2022  . Plantar fasciitis of right foot 06/01/2019  . Achilles tendinitis, right leg 06/01/2019  . Breast cancer, right breast (HCC) 05/14/2011   Past Medical History:  Diagnosis Date  . Anemia   . Breast cancer (HCC)   . S/P rotator cuff repair     Family History  Problem Relation Age of Onset  . Hypertension Mother   . Heart disease Father     Past Surgical History:  Procedure Laterality Date  . ABDOMINAL HYSTERECTOMY    . BREAST SURGERY     partial mastectomy  . CHOLECYSTECTOMY    . ROTATOR CUFF REPAIR     Social History   Occupational History  . Not on file  Tobacco Use  . Smoking status: Never  . Smokeless tobacco: Never  Substance and Sexual Activity  . Alcohol use: No  . Drug use: No  . Sexual activity: Not on file

## 2022-11-06 NOTE — Telephone Encounter (Signed)
VOB submitted for Durolane, right knee Appt.needs to be made after 12/07/2022

## 2022-12-10 ENCOUNTER — Telehealth: Payer: Self-pay | Admitting: Physician Assistant

## 2022-12-10 ENCOUNTER — Telehealth: Payer: Self-pay

## 2022-12-10 ENCOUNTER — Other Ambulatory Visit: Payer: Self-pay

## 2022-12-10 DIAGNOSIS — M170 Bilateral primary osteoarthritis of knee: Secondary | ICD-10-CM

## 2022-12-10 NOTE — Telephone Encounter (Signed)
Tried calling patient back to schedule for gel injection, but no answer and not able to leave a VM.  Please schedule an appt.with [facility] if patient calls back.  See referrals tab

## 2022-12-10 NOTE — Telephone Encounter (Signed)
Patient called asked why does she have a 20% OOP expense when she has Medicare, UHC and Medicaid. Patient asked for a call back as soon as possible. The number to contact patient is [phone]

## 2022-12-11 NOTE — Telephone Encounter (Signed)
Tried calling patient back concerning benefit information, but no answer and not able to leave a VM.

## 2022-12-21 ENCOUNTER — Encounter: Payer: Self-pay | Admitting: Physician Assistant

## 2022-12-21 ENCOUNTER — Ambulatory Visit (INDEPENDENT_AMBULATORY_CARE_PROVIDER_SITE_OTHER): Payer: 59 | Admitting: Physician Assistant

## 2022-12-21 DIAGNOSIS — M1711 Unilateral primary osteoarthritis, right knee: Secondary | ICD-10-CM | POA: Diagnosis not present

## 2022-12-21 MED ORDER — LIDOCAINE HCL 1 % IJ SOLN
2.0000 mL | INTRAMUSCULAR | Status: AC | PRN
Start: 2022-12-21 — End: 2022-12-21
  Administered 2022-12-21: 2 mL

## 2022-12-21 MED ORDER — BUPIVACAINE HCL 0.25 % IJ SOLN
2.0000 mL | INTRAMUSCULAR | Status: AC | PRN
Start: 2022-12-21 — End: 2022-12-21
  Administered 2022-12-21: 2 mL via INTRA_ARTICULAR

## 2022-12-21 MED ORDER — SODIUM HYALURONATE 60 MG/3ML IX PRSY
60.0000 mg | PREFILLED_SYRINGE | INTRA_ARTICULAR | Status: AC | PRN
Start: 2022-12-21 — End: 2022-12-21
  Administered 2022-12-21: 60 mg via INTRA_ARTICULAR

## 2022-12-21 NOTE — Progress Notes (Signed)
   Office Visit Note   Patient: [name]           Date of Birth: 10/18/1955           MRN: [id number] Visit Date: 12/21/2022              Requested by: Tarry Kos, MD [address] [place],  [place] [address] PCP: Dois Davenport, MD   Assessment & Plan: Visit Diagnoses:  1. Unilateral primary osteoarthritis, right knee     Plan: Impression is right knee osteoarthritis.  Today, proceed with right knee Durolane injection.  She tolerated this well.  Follow-up as needed.  Follow-Up Instructions: Return if symptoms worsen or fail to improve.   Orders:  Orders Placed This Encounter  Procedures   Large Joint Inj: R knee   No orders of the defined types were placed in this encounter.     Procedures: Large Joint Inj: R knee on 12/21/2022 2:34 PM Indications: pain Details: 22 G needle, anterolateral approach Medications: 2 mL lidocaine 1 %; 2 mL bupivacaine 0.25 %; 60 mg Sodium Hyaluronate 60 MG/3ML      Clinical Data: No additional findings.   Subjective: Chief Complaint  Patient presents with   Right Knee - Follow-up    Durolane    HPI patient is a pleasant 67 year old female who comes in today for right knee Durolane injection.  She has had gel injections in the past with good relief.     Objective: Vital Signs: There were no vitals taken for this visit.    Ortho Exam stable right knee exam  Specialty Comments:  No specialty comments available.  Imaging: No new imaging   PMFS History: Patient Active Problem List   Diagnosis Date Noted   Osteoarthritis of knees, bilateral 05/30/2022   Plantar fasciitis of right foot 06/01/2019   Achilles tendinitis, right leg 06/01/2019   Breast cancer, right breast (HCC) 05/14/2011   Past Medical History:  Diagnosis Date   Anemia    Breast cancer (HCC)    S/P rotator cuff repair     Family History  Problem Relation Age of Onset   Hypertension Mother    Heart disease Father     Past  Surgical History:  Procedure Laterality Date   ABDOMINAL HYSTERECTOMY     BREAST SURGERY     partial mastectomy   CHOLECYSTECTOMY     ROTATOR CUFF REPAIR     Social History   Occupational History   Not on file  Tobacco Use   Smoking status: Never   Smokeless tobacco: Never  Substance and Sexual Activity   Alcohol use: No   Drug use: No   Sexual activity: Not on file

## 2023-01-02 ENCOUNTER — Ambulatory Visit (INDEPENDENT_AMBULATORY_CARE_PROVIDER_SITE_OTHER): Payer: 59 | Admitting: Podiatry

## 2023-01-02 ENCOUNTER — Encounter: Payer: Self-pay | Admitting: Podiatry

## 2023-01-02 DIAGNOSIS — M722 Plantar fascial fibromatosis: Secondary | ICD-10-CM | POA: Diagnosis not present

## 2023-01-02 MED ORDER — TRIAMCINOLONE ACETONIDE 10 MG/ML IJ SUSP
10.0000 mg | Freq: Once | INTRAMUSCULAR | Status: AC
Start: 2023-01-02 — End: 2023-01-02
  Administered 2023-01-02: 10 mg via INTRA_ARTICULAR

## 2023-01-03 NOTE — Progress Notes (Signed)
Subjective:   Patient ID: [name], female   DOB: 67 y.o.   MRN: [id number]   HPI Patient states that she is still having pain in her left heel and may eventually need surgery and it hurts more in the center and outside of the heel   ROS      Objective:  Physical Exam  Neurovascular status unchanged exquisite discomfort in the center center lateral aspect of the fascia also moderately in the medial     Assessment:  Continuation of acute [place] fasciitis left     Plan:  Reviewed that eventually this may require surgery as her right required surgery but the holidays are coming and she cannot do anything for the next several months so I went ahead today and I did do sterile prep and I carefully injected from the lateral side 3 mg Dexasone Kenalog 5 mg Xylocaine

## 2023-04-11 ENCOUNTER — Ambulatory Visit: Payer: 59 | Admitting: Physician Assistant

## 2023-04-12 ENCOUNTER — Ambulatory Visit: Payer: 59 | Admitting: Physician Assistant

## 2023-04-12 ENCOUNTER — Other Ambulatory Visit: Payer: Self-pay

## 2023-04-12 ENCOUNTER — Encounter: Payer: Self-pay | Admitting: Physician Assistant

## 2023-04-12 DIAGNOSIS — M25511 Pain in right shoulder: Secondary | ICD-10-CM

## 2023-04-12 DIAGNOSIS — M170 Bilateral primary osteoarthritis of knee: Secondary | ICD-10-CM

## 2023-04-12 MED ORDER — LIDOCAINE HCL 1 % IJ SOLN
3.0000 mL | INTRAMUSCULAR | Status: AC | PRN
  Administered 2023-04-12: 3 mL

## 2023-04-12 MED ORDER — METHYLPREDNISOLONE ACETATE 40 MG/ML IJ SUSP
40.0000 mg | INTRAMUSCULAR | Status: AC | PRN
Start: 2023-04-12 — End: 2023-04-12
  Administered 2023-04-12: 40 mg via INTRA_ARTICULAR

## 2023-04-12 MED ORDER — METHYLPREDNISOLONE ACETATE 40 MG/ML IJ SUSP
40.0000 mg | INTRAMUSCULAR | Status: AC | PRN
  Administered 2023-04-12: 40 mg via INTRA_ARTICULAR

## 2023-04-12 MED ORDER — LIDOCAINE HCL 1 % IJ SOLN
3.0000 mL | INTRAMUSCULAR | Status: AC | PRN
Start: 2023-04-12 — End: 2023-04-12
  Administered 2023-04-12: 3 mL

## 2023-04-12 NOTE — Progress Notes (Signed)
 Office Visit Note   Patient: [name]           Date of Birth: Aug 26, 1955           MRN: [id number] Visit Date: 04/12/2023              Requested by: Dois Davenport, MD [address] [place],  [place] [address] PCP: Dois Davenport, MD   Assessment & Plan: Visit Diagnoses:  1. Acute pain of right shoulder   2. Primary osteoarthritis of both knees     Plan: Patient is a pleasant 68 year old woman who I inject periodically with steroids into her knees.  She has had gel injections but has not had a steroid injection in a while.  Requesting an injection into her right knee no new injury.  Also is having some problems with her right shoulder.  Remote history of what sounds like a rotator cuff repair.  She is now having pain with overhead activities internal rotation behind her back.  She is not diabetic.  Exam consistent with rotator cuff irritation.  Will provide her with a injection into the subacromial space as well as the right knee today may follow-up with me as needed  Follow-Up Instructions: No follow-ups on file.   Orders:  Orders Placed This Encounter  Procedures  . XR Shoulder Right   No orders of the defined types were placed in this encounter.     Procedures: Large Joint Inj: R subacromial bursa on 04/12/2023 2:27 PM Indications: diagnostic evaluation and pain Details: 25 G 1.5 in needle, posterior approach  Arthrogram: No  Medications: 3 mL lidocaine 1 %; 40 mg methylPREDNISolone acetate 40 MG/ML Outcome: tolerated well, no immediate complications Procedure, treatment alternatives, risks and benefits explained, specific risks discussed. Consent was given by the patient.    Large Joint Inj: R knee on 04/12/2023 2:28 PM Indications: pain and diagnostic evaluation Details: 25 G 1.5 in needle, anteromedial approach  Arthrogram: No  Medications: 40 mg methylPREDNISolone acetate 40 MG/ML; 3 mL lidocaine 1 % Outcome: tolerated well, no immediate  complications Procedure, treatment alternatives, risks and benefits explained, specific risks discussed. Consent was given by the patient.     Clinical Data: No additional findings.   Subjective: Chief Complaint  Patient presents with  . Right Shoulder - Pain  . Right Knee - Pain    HPI pleasant 68 year old woman presents today with a chief complaint of right shoulder pain and right knee pain.  She has a history of arthritis in the right knee and she periodically gets injections no new injury.  She has had no injury to her right shoulder but has a history of rotator cuff surgery.  She has been doing a lot of mopping wonder if this aggravated it.  Denies any paresthesias denies any neck pain  Review of Systems  All other systems reviewed and are negative.    Objective: Vital Signs: There were no vitals taken for this visit.  Physical Exam Constitutional:      Appearance: Normal appearance.  Pulmonary:     Effort: Pulmonary effort is normal.  Skin:    General: Skin is warm and dry.  Neurological:     Mental Status: She is alert.  Psychiatric:        Mood and Affect: Mood normal.        Behavior: Behavior normal.   Ortho Exam Right knee she is neurovascular intact she has good extension and flexion she is  tender globally over the joint line.  No effusion no erythema compartments are soft and compressible Right shoulder she has pain with overhead activities internal rotation behind her back reproduce in the shoulder.  No pain with external rotation her strength is intact with external/internal rotation and resisted abduction.  She does have a positive empty can test and positive impingement findings Specialty Comments:  No specialty comments available.  Imaging: No results found.   PMFS History: Patient Active Problem List   Diagnosis Date Noted  . Pain in right shoulder 04/12/2023  . Osteoarthritis of knees, bilateral 05/30/2022  . Plantar fasciitis of right foot  06/01/2019  . Achilles tendinitis, right leg 06/01/2019  . Breast cancer, right breast (HCC) 05/14/2011   Past Medical History:  Diagnosis Date  . Anemia   . Breast cancer (HCC)   . S/P rotator cuff repair     Family History  Problem Relation Age of Onset  . Hypertension Mother   . Heart disease Father     Past Surgical History:  Procedure Laterality Date  . ABDOMINAL HYSTERECTOMY    . BREAST SURGERY     partial mastectomy  . CHOLECYSTECTOMY    . ROTATOR CUFF REPAIR     Social History   Occupational History  . Not on file  Tobacco Use  . Smoking status: Never  . Smokeless tobacco: Never  Substance and Sexual Activity  . Alcohol use: No  . Drug use: No  . Sexual activity: Not on file

## 2023-04-26 ENCOUNTER — Other Ambulatory Visit: Payer: Self-pay | Admitting: Family Medicine

## 2023-04-26 DIAGNOSIS — Z1231 Encounter for screening mammogram for malignant neoplasm of breast: Secondary | ICD-10-CM

## 2023-05-02 ENCOUNTER — Ambulatory Visit
Admission: RE | Admit: 2023-05-02 | Discharge: 2023-05-02 | Discharge status: Home / Self Care | Source: Ambulatory Visit | Attending: Family Medicine | Admitting: Family Medicine

## 2023-05-02 DIAGNOSIS — Z1231 Encounter for screening mammogram for malignant neoplasm of breast: Secondary | ICD-10-CM

## 2023-05-02 HISTORY — DX: Personal history of irradiation: Z92.3

## 2023-05-29 ENCOUNTER — Ambulatory Visit: Admitting: Physician Assistant

## 2023-05-29 ENCOUNTER — Encounter: Payer: Self-pay | Admitting: Physician Assistant

## 2023-05-29 DIAGNOSIS — M25511 Pain in right shoulder: Secondary | ICD-10-CM | POA: Diagnosis not present

## 2023-05-29 DIAGNOSIS — G8929 Other chronic pain: Secondary | ICD-10-CM

## 2023-05-29 DIAGNOSIS — M75101 Unspecified rotator cuff tear or rupture of right shoulder, not specified as traumatic: Secondary | ICD-10-CM | POA: Insufficient documentation

## 2023-05-29 MED ORDER — HYDROCODONE-ACETAMINOPHEN 5-325 MG PO TABS: 1.0000 | ORAL_TABLET | Freq: Four times a day (QID) | ORAL | 0 refills | Status: DC | PRN

## 2023-05-29 NOTE — Addendum Note (Signed)
 Addended by: Georgann Kim on: 05/29/2023 01:51 PM   Modules accepted: Orders

## 2023-05-29 NOTE — Progress Notes (Signed)
 Office Visit Note   Patient: [name]           Date of Birth: 12/29/1955           MRN: [id number] Visit Date: 05/29/2023              Requested by: Dois Davenport, MD [address] [place],  [place] [address] PCP: Dois Davenport, MD  No chief complaint on file.     HPI: Clarisse Gouge is a pleasant 68 year old woman with a 6-week history of right shoulder pain.  She does not remember any particular injury but she is status post right rotator cuff repair many years ago done elsewhere.  At her last visit she was given a subacromial injection which only helped her for a limited time.  She now feels like her arm is getting weaker.  Pain is now worse than it was before.  She is also tried Tylenol arthritis without any help  Assessment & Plan: Visit Diagnoses: Rotator cuff tear right shoulder  Plan: Given her history of previous rotator cuff repair and increasing symptoms with weakness and minimal help with an injection we will go forward with an MRI she can follow-up with Dr. August Saucer.  Possibly she could get a ultrasound-guided injection into the glenohumeral joint if it shows more arthritic though on exam today she seems more rotator cuff pathology  Follow-Up Instructions: After MRI with Dr. Johnell Comings Exam  Patient is alert, oriented, no adenopathy, well-dressed, normal affect, normal respiratory effort. Right shoulder she has abduction to about 75 degrees she is able to hold it there however she cannot internally rotate behind her back she can only go up to about 90 degrees.  Sensation is intact  Imaging: No results found. No images are attached to the encounter.  Labs: Lab Results  Component Value Date   REPTSTATUS 02/12/2020 FINAL 02/10/2020   CULT >=100,000 COLONIES/mL ESCHERICHIA COLI (A) 02/10/2020   LABORGA ESCHERICHIA COLI (A) 02/10/2020     Lab Results  Component Value Date   ALBUMIN 3.9 02/11/2020   ALBUMIN 3.4 (L) 06/21/2017   ALBUMIN 4.1  03/09/2013    No results found for: "MG" Lab Results  Component Value Date   VD25OH 21 (L) 11/29/2011   VD25OH 23 (L) 11/23/2010   VD25OH 16 (L) 05/10/2010    No results found for: "PREALBUMIN"    Latest Ref Rng & Units 02/11/2020   12:01 PM 06/21/2017    8:17 PM 06/21/2017    1:52 AM  CBC EXTENDED  WBC 4.0 - 10.5 K/uL 5.2  6.1  7.1   RBC 3.87 - 5.11 MIL/uL 5.17  5.03  4.70   Hemoglobin 12.0 - 15.0 g/dL 19.1  47.8  29.5   HCT 36.0 - 46.0 % 41.8  38.4  36.8   Platelets 150 - 400 K/uL 156  205  197      There is no height or weight on file to calculate BMI.  Orders:  No orders of the defined types were placed in this encounter.  Meds ordered this encounter  Medications   HYDROcodone-acetaminophen (NORCO/VICODIN) 5-325 MG tablet    Sig: Take 1 tablet by mouth every 6 (six) hours as needed for moderate pain (pain score 4-6).    Dispense:  30 tablet    Refill:  0     Procedures: No procedures performed  Clinical Data: No additional findings.  ROS:  All other systems negative, except as noted in the  HPI. Review of Systems  Objective: Vital Signs: There were no vitals taken for this visit.  Specialty Comments:  No specialty comments available.  PMFS History: Patient Active Problem List   Diagnosis Date Noted   Unspecified rotator cuff tear or rupture of right shoulder, not specified as traumatic 05/29/2023   Pain in right shoulder 04/12/2023   Osteoarthritis of knees, bilateral 05/30/2022   Plantar fasciitis of right foot 06/01/2019   Achilles tendinitis, right leg 06/01/2019   Breast cancer, right breast (HCC) 05/14/2011   Past Medical History:  Diagnosis Date   Anemia    Breast cancer (HCC)    Personal history of radiation therapy    S/P rotator cuff repair     Family History  Problem Relation Age of Onset   Hypertension Mother    Heart disease Father     Past Surgical History:  Procedure Laterality Date   ABDOMINAL HYSTERECTOMY     BREAST  LUMPECTOMY Right    BREAST SURGERY     partial mastectomy   CHOLECYSTECTOMY     ROTATOR CUFF REPAIR     Social History   Occupational History   Not on file  Tobacco Use   Smoking status: Never   Smokeless tobacco: Never  Substance and Sexual Activity   Alcohol use: No   Drug use: No   Sexual activity: Not on file

## 2023-06-08 ENCOUNTER — Ambulatory Visit
Admission: RE | Admit: 2023-06-08 | Discharge: 2023-06-08 | Disposition: A | Discharge status: Home / Self Care | Source: Ambulatory Visit | Attending: Physician Assistant | Admitting: Physician Assistant

## 2023-06-08 DIAGNOSIS — G8929 Other chronic pain: Secondary | ICD-10-CM

## 2023-06-12 ENCOUNTER — Ambulatory Visit (INDEPENDENT_AMBULATORY_CARE_PROVIDER_SITE_OTHER): Admitting: Podiatrist

## 2023-06-12 ENCOUNTER — Encounter: Payer: Self-pay | Admitting: Podiatrist

## 2023-06-12 DIAGNOSIS — M5416 Radiculopathy, lumbar region: Secondary | ICD-10-CM | POA: Insufficient documentation

## 2023-06-12 DIAGNOSIS — M722 Plantar fascial fibromatosis: Secondary | ICD-10-CM | POA: Diagnosis not present

## 2023-06-12 NOTE — Progress Notes (Signed)
  Chief Complaint  Patient presents with   Plantar Fasciitis    Reequesting injection for plantar fasciitis left foot heel pain.     HPI: Patient is 68 y.o. female who presents today for heel pain left heel-  lateral side. She relates the injection she received from Dr Celia Coles at her last visit was helpful and lasted her several months.    Allergies  Allergen Reactions   Nitrofurantoin      Review of systems is negative except as noted in the HPI.  Denies nausea/ vomiting/ fevers/ chills or night sweats.   Denies difficulty breathing, denies calf pain or tenderness  Physical Exam  Patient is awake, alert, and oriented x 3.  In no acute distress.    Vascular status is intact with palpable pedal pulses DP and PT bilateral and capillary refill time less than 3 seconds bilateral.  No edema or erythema noted.   Neurological exam reveals epicritic and protective sensation grossly intact bilateral.   Dermatological exam reveals skin is supple and dry to bilateral feet.  No open lesions present.    Musculoskeletal exam: Musculature intact with dorsiflexion, plantarflexion, inversion, eversion. Pain on palpation lateral band of plantar fascia left foot noted.  Some inflammation in the soft tissues palpated in this area   Assessment:   ICD-10-CM   1. Plantar fasciitis, left  M72.2        Plan:  injection given per patients request-  see below.  She will call if this fails to relieve her pain.   Procedure: Injection left foot Discussed alternatives, risks, complications and verbal consent was obtained.  Location: lateral insertion of plantar fascia left.  Skin Prep: Alcohol. Injectate: 1cc 0.5% marcaine  plain, 1 cc 10 mg Kenalog  Disposition: Patient tolerated procedure well. Injection site dressed with a band-aid.  Post-injection care was discussed and return precautions discussed.

## 2023-06-14 ENCOUNTER — Ambulatory Visit (INDEPENDENT_AMBULATORY_CARE_PROVIDER_SITE_OTHER): Admitting: Physician Assistant

## 2023-06-14 ENCOUNTER — Telehealth: Payer: Self-pay | Admitting: Radiology

## 2023-06-14 DIAGNOSIS — M25511 Pain in right shoulder: Secondary | ICD-10-CM

## 2023-06-14 DIAGNOSIS — M170 Bilateral primary osteoarthritis of knee: Secondary | ICD-10-CM | POA: Diagnosis not present

## 2023-06-14 NOTE — Telephone Encounter (Signed)
 Patient requests right knee visco injection before 06/30/2023 when her daughter graduates. Last visco 12/21/2023.  Could you please try and obtain authorization for [name]? Thanks!

## 2023-06-14 NOTE — Progress Notes (Signed)
 Office Visit Note   Patient: [name]           Date of Birth: May 27, 1955           MRN: [id number] Visit Date: 06/14/2023              Requested by: Allene Ivan, MD [address] [place],  [place] [address] PCP: Allene Ivan, MD  Chief Complaint  Patient presents with   Right Shoulder - Pain, Follow-up    MRI review      HPI: [name] is a pleasant 68 year old woman who comes in for 2 things today.  First she comes in to review an MRI scan done of her right shoulder.  She is about 12 years status post rotator cuff repair done elsewhere.  She has had return of shoulder pain that has not been helped to a great extent by the subacromial injections and not had much luck with PT.  Secondly she would like to get another viscosupplementation into her right knee last one was in November so she would be eligible after May 8 for an injection.  She said she tried a steroid injection in the knee in the interim and it just did not help her as much.  Assessment & Plan: Visit Diagnoses:  1. Right shoulder pain, unspecified chronicity   2. Primary osteoarthritis of both knees     Plan: Reviewed her MRI with her it was limited by a lot of artifact.  Questionable whether she has a small full-thickness tear possibly in the questionable supraspinatus.  She also has tendinopathy.  She does have some glenohumeral findings and her exam seems to be more consistent with that.  I am going to refer her to [facility] and see if he can give her an injection and see how she does and for further recommendations as well.  With regards to her knee I will go forward and authorize for viscosupplementation which she understands she cannot have until after May 8 This patient is diagnosed with osteoarthritis of the knee(s).    Radiographs show evidence of joint space narrowing, osteophytes, subchondral sclerosis and/or subchondral cysts.  This patient has knee pain which interferes with functional  and activities of daily living.    This patient has experienced inadequate response, adverse effects and/or intolerance with conservative treatments such as acetaminophen , NSAIDS, topical creams, physical therapy or regular exercise, knee bracing and/or weight loss.   This patient has experienced inadequate response or has a contraindication to intra articular steroid injections for at least 3 months.   This patient is not scheduled to have a total knee replacement within 6 months of starting treatment with viscosupplementation.   Follow-Up Instructions: Return in about 2 weeks (around 06/28/2023).   Ortho Exam  Patient is alert, oriented, no adenopathy, well-dressed, normal affect, normal respiratory effort. Examination of her right shoulder she has pain with forward elevation internal rotation behind the back but more pain today with external rotation which reproduces pain deep more over the glenohumeral joint. Examination of her right knee she has  crepitus no effusion neurovascularly intact compartments are soft and tender nontender no evidence of infection  Imaging: No results found. No images are attached to the encounter.  Labs: Lab Results  Component Value Date   REPTSTATUS 02/12/2020 FINAL 02/10/2020   CULT >=100,000 COLONIES/mL ESCHERICHIA COLI (A) 02/10/2020   LABORGA ESCHERICHIA COLI (A) 02/10/2020     Lab Results  Component Value Date   ALBUMIN  3.9 02/11/2020   ALBUMIN 3.4 (L) 06/21/2017   ALBUMIN 4.1 03/09/2013    No results found for: "MG" Lab Results  Component Value Date   VD25OH 21 (L) 11/29/2011   VD25OH 23 (L) 11/23/2010   VD25OH 16 (L) 05/10/2010    No results found for: "PREALBUMIN"    Latest Ref Rng & Units 02/11/2020   12:01 PM 06/21/2017    8:17 PM 06/21/2017    1:52 AM  CBC EXTENDED  WBC 4.0 - 10.5 K/uL 5.2  6.1  7.1   RBC 3.87 - 5.11 MIL/uL 5.17  5.03  4.70   Hemoglobin 12.0 - 15.0 g/dL 14.7  82.9  56.2   HCT 36.0 - 46.0 % 41.8  38.4  36.8    Platelets 150 - 400 K/uL 156  205  197      There is no height or weight on file to calculate BMI.  Orders:  No orders of the defined types were placed in this encounter.  No orders of the defined types were placed in this encounter.    Procedures: No procedures performed  Clinical Data: No additional findings.  ROS:  All other systems negative, except as noted in the HPI. Review of Systems  Objective: Vital Signs: There were no vitals taken for this visit.  Specialty Comments:  No specialty comments available.  PMFS History: Patient Active Problem List   Diagnosis Date Noted   Lumbar radiculopathy 06/12/2023   Unspecified rotator cuff tear or rupture of right shoulder, not specified as traumatic 05/29/2023   Pain in right shoulder 04/12/2023   Osteoarthritis of knees, bilateral 05/30/2022   Plantar fasciitis of right foot 06/01/2019   Achilles tendinitis, right leg 06/01/2019   History of breast cancer 06/13/2017   Type 2 diabetes mellitus (HCC) 06/13/2017   Breast cancer, right breast (HCC) 05/14/2011   Past Medical History:  Diagnosis Date   Anemia    Breast cancer (HCC)    Personal history of radiation therapy    S/P rotator cuff repair     Family History  Problem Relation Age of Onset   Hypertension Mother    Heart disease Father     Past Surgical History:  Procedure Laterality Date   ABDOMINAL HYSTERECTOMY     BREAST LUMPECTOMY Right    BREAST SURGERY     partial mastectomy   CHOLECYSTECTOMY     ROTATOR CUFF REPAIR     Social History   Occupational History   Not on file  Tobacco Use   Smoking status: Never   Smokeless tobacco: Never  Substance and Sexual Activity   Alcohol use: No   Drug use: No   Sexual activity: Not on file

## 2023-06-17 NOTE — Telephone Encounter (Signed)
 VOB submitted for Durolane, right knee.

## 2023-06-21 ENCOUNTER — Ambulatory Visit (INDEPENDENT_AMBULATORY_CARE_PROVIDER_SITE_OTHER): Admitting: Podiatrist

## 2023-06-21 ENCOUNTER — Encounter: Payer: Self-pay | Admitting: Podiatrist

## 2023-06-21 VITALS — Ht 60.0 in | Wt 200.0 lb

## 2023-06-21 DIAGNOSIS — M722 Plantar fascial fibromatosis: Secondary | ICD-10-CM | POA: Diagnosis not present

## 2023-06-21 MED ORDER — PREDNISONE 10 MG (21) PO TBPK
ORAL_TABLET | ORAL | 0 refills | Status: DC
Start: 2023-06-21 — End: 2023-07-23

## 2023-06-21 NOTE — Progress Notes (Signed)
 Chief Complaint  Patient presents with   Foot Pain    RM12: Est Left heel is in pain HX chronic plantar fasciitis of left foot     HPI: Patient is 68 y.o. female who presents today for heel pain left heel.  She relates the injection at the last visit on the lateral aspect of the left heel was beneficial however now she has pain on the medial aspect of the foot.  Relates she was up cooking a meal for about 4 hours  and noticed the pain in the left foot had gotten worse.  She is getting ready to go out of town and is trying to take care of her foot.    Allergies  Allergen Reactions   Nitrofurantoin      Review of systems is negative except as noted in the HPI.  Denies nausea/ vomiting/ fevers/ chills or night sweats.   Denies difficulty breathing, denies calf pain or tenderness  Physical Exam  Patient is awake, alert, and oriented x 3.  In no acute distress.   Neurovascular intact and unchanged to the left foot.  Pain medial aspect of the left instep is noted.  2 cm proximal to the first metatarsal head.  Pain within the fascia in this area is identified.  No pain at the medial insertion of the plantar fascia or lateral foot insertion of the plantar fascia at today's visit.    Assessment:   ICD-10-CM   1. Plantar fascial fibromatosis of left foot  M72.2        Plan: Discussed exam findings.  The area of her discomfort has moved to the medial aspect of the left foot at today's visit.  Offered an injection as well as a Medrol  Dosepak.  She would like to move forward with both.  I did agree to put the skin with alcohol infiltrated Marcaine  and 10 mg of Kenalog  in the medial instep of the area of discomfort.  She tolerated this well.  I called in a's Medrol  Dosepak for her to take and she will call if this fails to relieve her pain.  We also discussed her wearing the boot at home and she has a boot and will start to wear this anytime she is up on her feet for long periods of time.

## 2023-06-26 ENCOUNTER — Other Ambulatory Visit: Payer: Self-pay

## 2023-06-26 DIAGNOSIS — M1711 Unilateral primary osteoarthritis, right knee: Secondary | ICD-10-CM

## 2023-06-27 ENCOUNTER — Ambulatory Visit: Admitting: Surgical

## 2023-06-27 DIAGNOSIS — M75111 Incomplete rotator cuff tear or rupture of right shoulder, not specified as traumatic: Secondary | ICD-10-CM

## 2023-06-27 DIAGNOSIS — M1711 Unilateral primary osteoarthritis, right knee: Secondary | ICD-10-CM

## 2023-06-28 ENCOUNTER — Encounter: Payer: Self-pay | Admitting: Surgical

## 2023-06-28 MED ORDER — SODIUM HYALURONATE 60 MG/3ML IX PRSY
60.0000 mg | PREFILLED_SYRINGE | INTRA_ARTICULAR | Status: AC | PRN
Start: 2023-06-27 — End: 2023-06-27
  Administered 2023-06-27: 60 mg via INTRA_ARTICULAR

## 2023-06-28 MED ORDER — LIDOCAINE HCL 1 % IJ SOLN
5.0000 mL | INTRAMUSCULAR | Status: AC | PRN
  Administered 2023-06-27: 5 mL

## 2023-06-28 NOTE — Progress Notes (Signed)
 Office Visit Note   Patient: [name]           Date of Birth: [birth year]           MRN: [id number] Visit Date: 06/27/2023 Requested by: Allene Ivan, MD [address] [place],  [place] [address] PCP: Allene Ivan, MD  Subjective: Chief Complaint  Patient presents with   Right Shoulder - Pain    MRI review   Right Knee - Pain    Durolane injection    HPI: [name] is a 68 y.o. female who presents to the office for MRI review.  MRI was ordered by Norma Beckers persons PA-C after failure of conservative management.  Did not respond well to subacromial injections.  She describes primarily anterolateral right shoulder pain with radiation down to as low as the elbow at times.  She has no numbness or tingling, neck pain.  She will have occasional burning sensation in her scapular region but this only happens on average 1-2 times per month.  She has history of prior rotator cuff surgery by Dr. Alfredo Ano that was done about 12 years ago.  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  Her shoulder pain began insidiously without injury event.  She does note subjective weakness especially with trying to take things out from the refrigerator.  She is right-hand dominant.  Right shoulder has felt a little bit better since she has completed a steroid pack yesterday for plantar fasciitis as prescribed by another provider.  Patient is also here today for right knee Durolane injection.  MRI results revealed: MR SHOULDER RIGHT WO CONTRAST Result Date: 06/14/2023 CLINICAL DATA:  Right shoulder pain for 2 months with reduced range of motion. Prior rotator cuff repair. EXAM: MRI OF THE RIGHT SHOULDER WITHOUT CONTRAST TECHNIQUE: Multiplanar, multisequence MR imaging of the shoulder was performed. No intravenous contrast was administered. COMPARISON:  Radiographs 04/12/2023 FINDINGS: Despite efforts by the technologist and patient, severe motion  artifact is present on today's exam and could not be eliminated. This reduces exam sensitivity and specificity. Rotator cuff: Partial thickness articular surface tearing of the distal infraspinatus tendon, images 11-12 series 3. Linear fluid signal intensity tracking through the supraspinatus tendon on image 14 series 3 suspicious for a small full-thickness partial width tear, although admittedly narrow/small. Mild to moderate supraspinatus tendinopathy. Muscles:  Unremarkable Biceps long head: Indistinct due to motion artifact, probably grossly intact. Acromioclavicular Joint: No significant arthropathy. Narrow distal acromion, query prior acromioplasty. Type II acromion. Trace fluid in the subacromial subdeltoid bursa. Glenohumeral Joint: Suspected mild degenerative chondral thinning. Mild spurring of the inferior glenoid. Labrum:  Poorly seen due to motion.  Grossly intact. Bones:  Anchor screws for prior rotator cuff repair noted. Other: No supplemental non-categorized findings. IMPRESSION: 1. Severe motion artifact reduces exam sensitivity and specificity on multiple sequences. 2. Partial thickness articular surface tearing of the distal infraspinatus tendon. 3. Linear fluid signal intensity tracking through the supraspinatus tendon suspicious for a small full-thickness partial width tear, although admittedly narrow/small. Mild to moderate supraspinatus tendinopathy. 4. Trace fluid in the subacromial subdeltoid bursa. 5. Mild degenerative chondral thinning in the glenohumeral joint. Electronically Signed   By: Freida Jes M.D.   On: 06/14/2023 08:37                 ROS: All systems reviewed are negative as they relate to the chief complaint within the history of present illness.  Patient denies fevers or chills.  Assessment & Plan: Visit Diagnoses:  1. Nontraumatic incomplete tear of right rotator cuff   2. Unilateral primary osteoarthritis, right knee     Plan: [name] is a 68 y.o.  female who presents to the office for review of right shoulder MRI.  Also here for right knee Durolane injection.  Tolerated Durolane injection well without complication.  She will follow-up as needed for her right knee.  Regarding the right shoulder MRI, she has scan demonstrating partial-thickness articular sided tear of infraspinatus with some high-grade partial tearing of the supraspinatus with 1 images that seems to indicate possibility of full-thickness tear penetration.  No significant evidence of severe arthritis though motion artifact has substantially reduced the quality of the axial views.  We discussed options for her shoulder.  Currently it is actually feeling pretty functional for her after finishing steroid Dosepak for another problem.  She was able to do a lot of of lifting and work around the house earlier this week.  Think that instead of a subacromial injection, with her articular sided tearing and suggestion of contribution from her bicep tendon, she may respond better to glenohumeral injection.  We can try this in 2 to 3 weeks to give her time out from the steroid pack.  If this does not give her sustained relief, may need to consider revision rotator cuff repair.  She can follow-up with Dr. Rozelle Corning after injection several months later to see how she is doing.  Follow-Up Instructions: No follow-ups on file.   Orders:  No orders of the defined types were placed in this encounter.  No orders of the defined types were placed in this encounter.     Procedures: Large Joint Inj: R knee on 06/27/2023 11:24 AM Indications: diagnostic evaluation, joint swelling and pain Details: 18 G 1.5 in needle, superolateral approach  Arthrogram: No  Medications: 5 mL lidocaine  1 %; 60 mg Sodium Hyaluronate 60 MG/3ML Outcome: tolerated well, no immediate complications Procedure, treatment alternatives, risks and benefits explained, specific risks discussed. Consent was given by the patient.  Immediately prior to procedure a time out was called to verify the correct patient, procedure, equipment, support staff and site/side marked as required. Patient was prepped and draped in the usual sterile fashion.       Clinical Data: No additional findings.  Objective: Vital Signs: There were no vitals taken for this visit.  Physical Exam:  Constitutional: Patient appears well-developed HEENT:  Head: Normocephalic Eyes:EOM are normal Neck: Normal range of motion Cardiovascular: Normal rate Pulmonary/chest: Effort normal Neurologic: Patient is alert Skin: Skin is warm Psychiatric: Patient has normal mood and affect  Ortho Exam: Ortho exam demonstrates right shoulder with 60 degrees X rotation, 100 agrees abduction, 120 degrees forward elevation passively and actively.  This compared with the left shoulder with 50 degrees X rotation, 115 degrees abduction from 180 degrees forward elevation passively and actively.  Her range of motion of her right shoulder overhead is more limited by her pain rather than true stiffness.  She has intact rotator cuff strength of supra, infra, subscap without any significant weakness compared with contralateral side.  Axillary nerve is intact with deltoid firing.  She has intact EPL, FPL, finger abduction, grip strength testing, pronation/supination, bicep, tricep, deltoid.  She has positive O'Brien sign with pain localizing to the anterior aspect of the shoulder.  She has moderate tenderness over the bicipital groove.  Mild tenderness over the [facility] joint.  No tenderness throughout  the cervical spine.  No pain with cervical spine range of motion.  Specialty Comments:  No specialty comments available.  Imaging: No results found.   PMFS History: Patient Active Problem List   Diagnosis Date Noted   Lumbar radiculopathy 06/12/2023   Unspecified rotator cuff tear or rupture of right shoulder, not specified as traumatic 05/29/2023   Pain in right shoulder  04/12/2023   Osteoarthritis of knees, bilateral 05/30/2022   Plantar fasciitis of right foot 06/01/2019   Achilles tendinitis, right leg 06/01/2019   History of breast cancer 06/13/2017   Type 2 diabetes mellitus (HCC) 06/13/2017   Breast cancer, right breast (HCC) 05/14/2011   Past Medical History:  Diagnosis Date   Anemia    Breast cancer (HCC)    Personal history of radiation therapy    S/P rotator cuff repair     Family History  Problem Relation Age of Onset   Hypertension Mother    Heart disease Father     Past Surgical History:  Procedure Laterality Date   ABDOMINAL HYSTERECTOMY     BREAST LUMPECTOMY Right    BREAST SURGERY     partial mastectomy   CHOLECYSTECTOMY     ROTATOR CUFF REPAIR     Social History   Occupational History   Not on file  Tobacco Use   Smoking status: Never   Smokeless tobacco: Never  Substance and Sexual Activity   Alcohol use: No   Drug use: No   Sexual activity: Not on file

## 2023-07-18 ENCOUNTER — Encounter: Payer: Self-pay | Admitting: Surgical

## 2023-07-18 ENCOUNTER — Other Ambulatory Visit: Payer: Self-pay

## 2023-07-18 ENCOUNTER — Ambulatory Visit (INDEPENDENT_AMBULATORY_CARE_PROVIDER_SITE_OTHER): Admitting: Surgical

## 2023-07-18 DIAGNOSIS — M25511 Pain in right shoulder: Secondary | ICD-10-CM

## 2023-07-18 DIAGNOSIS — G8929 Other chronic pain: Secondary | ICD-10-CM | POA: Diagnosis not present

## 2023-07-18 DIAGNOSIS — M75111 Incomplete rotator cuff tear or rupture of right shoulder, not specified as traumatic: Secondary | ICD-10-CM | POA: Diagnosis not present

## 2023-07-18 MED ORDER — TRIAMCINOLONE ACETONIDE 40 MG/ML IJ SUSP
40.0000 mg | INTRAMUSCULAR | Status: AC | PRN
  Administered 2023-07-18: 40 mg via INTRA_ARTICULAR

## 2023-07-18 MED ORDER — LIDOCAINE HCL 1 % IJ SOLN
5.0000 mL | INTRAMUSCULAR | Status: AC | PRN
  Administered 2023-07-18: 5 mL

## 2023-07-18 MED ORDER — BUPIVACAINE HCL 0.25 % IJ SOLN
9.0000 mL | INTRAMUSCULAR | Status: AC | PRN
  Administered 2023-07-18: 9 mL via INTRA_ARTICULAR

## 2023-07-18 NOTE — Progress Notes (Signed)
   Procedure Note  Patient: [name]             Date of Birth: 27-Oct-1955           MRN: [id number]             Visit Date: 07/18/2023  Procedures: Visit Diagnoses:  1. Nontraumatic incomplete tear of right rotator cuff   2. Chronic right shoulder pain     Large Joint Inj: R glenohumeral on 07/18/2023 2:44 PM Indications: pain and diagnostic evaluation Details: 22 G 3.5 in needle, ultrasound-guided posterior approach Medications: 5 mL lidocaine  1 %; 9 mL bupivacaine  0.25 %; 40 mg triamcinolone  acetonide 40 MG/ML Outcome: tolerated well, no immediate complications Procedure, treatment alternatives, risks and benefits explained, specific risks discussed. Consent was given by the patient. Immediately prior to procedure a time out was called to verify the correct patient, procedure, equipment, support staff and site/side marked as required. Patient was prepped and draped in the usual sterile fashion.

## 2023-07-23 ENCOUNTER — Ambulatory Visit (INDEPENDENT_AMBULATORY_CARE_PROVIDER_SITE_OTHER): Admitting: Podiatry

## 2023-07-23 ENCOUNTER — Ambulatory Visit (INDEPENDENT_AMBULATORY_CARE_PROVIDER_SITE_OTHER)

## 2023-07-23 DIAGNOSIS — M722 Plantar fascial fibromatosis: Secondary | ICD-10-CM

## 2023-07-23 MED ORDER — TRIAMCINOLONE ACETONIDE 40 MG/ML IJ SUSP
20.0000 mg | Freq: Once | INTRAMUSCULAR | Status: AC
  Administered 2023-07-23: 20 mg

## 2023-07-23 MED ORDER — MELOXICAM 15 MG PO TABS: 15.0000 mg | ORAL_TABLET | Freq: Every day | ORAL | 3 refills | Status: DC

## 2023-07-23 MED ORDER — METHYLPREDNISOLONE 4 MG PO TBPK: ORAL_TABLET | ORAL | 0 refills | Status: AC

## 2023-07-24 NOTE — Progress Notes (Signed)
 She presents today with chief complaint of right heel pain she states that the pain is so severe and throbbing that she can barely put weight down on her foot.  She has been wearing her boot that she had at home which she wants more for plantar fasciotomy of the other foot.  Objective: Vital signs are stable alert oriented x 3.  Pulses are palpable.  She has moderate to severe pain on palpation medial calcaneal tubercle of the right heel.  No pain on medial and lateral compression of the calcaneus.  She has limited range of motion of the first metatarsal phalangeal joint.  Radiographs taken today demonstrate osseously mature foot that heel is not even on the Ortho poser.  She does have dorsal spurring and joint space narrowing subchondral sclerosis at the first metatarsal phalangeal joint.  Soft tissue increase in density at the plantar fascial calcaneal insertion site consistent with plantar fasciitis.  Assessment: Plantar fasciitis right foot.  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  Plan: Injected the right heel today 20 mg Kenalog  5 mg Marcaine  point maximal tenderness.  Her on methylprednisolone  to be followed by meloxicam .  We discussed appropriate shoe gear stretching exercises ice therapy shoe gear modifications.

## 2023-08-14 ENCOUNTER — Ambulatory Visit (INDEPENDENT_AMBULATORY_CARE_PROVIDER_SITE_OTHER)

## 2023-08-14 ENCOUNTER — Ambulatory Visit (INDEPENDENT_AMBULATORY_CARE_PROVIDER_SITE_OTHER): Admitting: Podiatry

## 2023-08-14 ENCOUNTER — Encounter: Payer: Self-pay | Admitting: Podiatry

## 2023-08-14 DIAGNOSIS — M722 Plantar fascial fibromatosis: Secondary | ICD-10-CM | POA: Diagnosis not present

## 2023-08-14 MED ORDER — METHYLPREDNISOLONE 4 MG PO TBPK
ORAL_TABLET | ORAL | 0 refills | Status: AC
Start: 2023-08-14 — End: ?

## 2023-08-14 NOTE — Progress Notes (Signed)
 Subjective:   Patient ID: [name], female   DOB: 68 y.o.   MRN: [id number]   HPI Patient states her heel is still awful and she is having trouble with the boot and she would like something to try to lift up her arch and stretch her foot due to the intensity of discomfort   ROS      Objective:  Physical Exam  Vascular status intact with exquisite discomfort medial fascial band right at the insertion tendon calcaneus     Assessment:  Acute plantar fasciitis right with due to the intensity of discomfort cannot rule out stress fracture that she is not able to stretch properly and boot is very difficult to wear on this leg     Plan:  H&P reviewed and discussed and today I went ahead sterile prep and I injected the fascia at insertion 3 mg Kenalog  5 mg Xylocaine  and I dispensed a night splint to stretch the arch properly fitting it into the arch and will begin hot cold therapy.  Reappoint to recheck in approximately 3 weeks all questions answered  X-rays do not indicate obvious stress fracture this appears to be continued acute plantar fascial inflammation

## 2023-12-30 ENCOUNTER — Other Ambulatory Visit: Payer: Self-pay

## 2023-12-30 ENCOUNTER — Ambulatory Visit: Admitting: Surgical

## 2023-12-30 DIAGNOSIS — M25511 Pain in right shoulder: Secondary | ICD-10-CM

## 2023-12-30 DIAGNOSIS — M75111 Incomplete rotator cuff tear or rupture of right shoulder, not specified as traumatic: Secondary | ICD-10-CM

## 2023-12-30 DIAGNOSIS — G8929 Other chronic pain: Secondary | ICD-10-CM

## 2023-12-30 DIAGNOSIS — M19011 Primary osteoarthritis, right shoulder: Secondary | ICD-10-CM

## 2023-12-30 DIAGNOSIS — M1711 Unilateral primary osteoarthritis, right knee: Secondary | ICD-10-CM | POA: Diagnosis not present

## 2023-12-30 MED ORDER — IBUPROFEN 800 MG PO TABS
800.0000 mg | ORAL_TABLET | Freq: Three times a day (TID) | ORAL | 1 refills | Status: AC | PRN
Start: 2023-12-30 — End: ?

## 2024-01-05 ENCOUNTER — Encounter: Payer: Self-pay | Admitting: Surgical

## 2024-01-05 MED ORDER — TRIAMCINOLONE ACETONIDE 40 MG/ML IJ SUSP
40.0000 mg | INTRAMUSCULAR | Status: AC | PRN
  Administered 2023-12-30: 40 mg via INTRA_ARTICULAR

## 2024-01-05 MED ORDER — BUPIVACAINE HCL 0.25 % IJ SOLN
9.0000 mL | INTRAMUSCULAR | Status: AC | PRN
  Administered 2023-12-30: 9 mL via INTRA_ARTICULAR

## 2024-01-05 MED ORDER — BUPIVACAINE HCL 0.25 % IJ SOLN
4.0000 mL | INTRAMUSCULAR | Status: AC | PRN
  Administered 2023-12-30: 4 mL via INTRA_ARTICULAR

## 2024-01-05 MED ORDER — LIDOCAINE HCL 1 % IJ SOLN
5.0000 mL | INTRAMUSCULAR | Status: AC | PRN
  Administered 2023-12-30: 5 mL

## 2024-01-05 NOTE — Progress Notes (Signed)
 Office Visit Note   Patient: [name]           Date of Birth: [birth year]           MRN: [id number] Visit Date: 12/30/2023 Requested by: Burney Darice CROME, MD [address] [place],  [place] [address] PCP: Burney Darice CROME, MD  Subjective: Chief Complaint  Patient presents with   Right Shoulder - Pain   Right Knee - Pain    HPI: [name] is a 68 y.o. female who presents to the office reporting knee pain.  Has history of knee arthritis and mild glenohumeral arthritis with partial-thickness articular sided rotator cuff tear of the right shoulder.  No new falls or injuries.  No fevers or chills.  Previous injections have provided good relief and they are here today to repeat injections.  Right glenohumeral injection on 07/18/2023 gave her good relief for about 4 months.  Right knee gel injection with Durolane on 5/15 gave her great relief for about 5 months..                ROS: All systems reviewed are negative as they relate to the chief complaint within the history of present illness.  Patient denies fevers or chills.  Assessment & Plan: Visit Diagnoses:  1. Chronic right shoulder pain   2. Unilateral primary osteoarthritis, right knee     Plan: Plan is right glenohumeral injection today under ultrasound and right knee injection with cortisone.  Patient tolerated procedures well without complication.  We will see her back as needed with next possible injection in 4 months for the right shoulder.  Could repeat gel injection as soon as she wants since it has been about 6 months since last gel injection if this cortisone injection does not work as well..  Follow-Up Instructions: No follow-ups on file.   Orders:  Orders Placed This Encounter  Procedures   US  Guided Needle Placement - No Linked Charges   Meds ordered this encounter  Medications   ibuprofen  (ADVIL ) 800 MG tablet    Sig: Take 1 tablet (800 mg total) by mouth every 8 (eight) hours as needed.     Dispense:  60 tablet    Refill:  1      Procedures: Large Joint Inj: R glenohumeral on 12/30/2023 4:24 PM Indications: pain and diagnostic evaluation Details: 22 G 3.5 in needle, ultrasound-guided posterior approach Medications: 9 mL bupivacaine  0.25 %; 40 mg triamcinolone  acetonide 40 MG/ML Outcome: tolerated well, no immediate complications Procedure, treatment alternatives, risks and benefits explained, specific risks discussed. Consent was given by the patient. Immediately prior to procedure a time out was called to verify the correct patient, procedure, equipment, support staff and site/side marked as required. Patient was prepped and draped in the usual sterile fashion.    Large Joint Inj: R knee on 12/30/2023 4:25 PM Indications: diagnostic evaluation, joint swelling and pain Details: 18 G 1.5 in needle, superolateral approach  Arthrogram: No  Medications: 5 mL lidocaine  1 %; 4 mL bupivacaine  0.25 %; 40 mg triamcinolone  acetonide 40 MG/ML Outcome: tolerated well, no immediate complications Procedure, treatment alternatives, risks and benefits explained, specific risks discussed. Consent was given by the patient. Immediately prior to procedure a time out was called to verify the correct patient, procedure, equipment, support staff and site/side marked as required. Patient was prepped and draped in the usual sterile fashion.       Clinical Data: No additional findings.  Objective: Vital Signs: There  were no vitals taken for this visit.  Physical Exam:  Constitutional: Patient appears well-developed HEENT:  Head: Normocephalic Eyes:EOM are normal Neck: Normal range of motion Cardiovascular: Normal rate Pulmonary/chest: Effort normal Neurologic: Patient is alert Skin: Skin is warm Psychiatric: Patient has normal mood and affect  Ortho Exam: Ortho exam demonstrates knees without cellulitis or skin changes.  Effusion not present in the right knee.  No calf tenderness.   Negative Homans' sign.  No pain with hip range of motion.  Able to perform straight leg raise with both lower extremities.  Lower extremities warm and well-perfused.   Right shoulder has some crepitus with passive motion of the shoulder consistent with a little bit of rotator cuff pathology and some coarseness consistent with her glenohumeral arthritis noted on MRI from May 2025.  No cellulitis or skin changes noted.  Actually nerve intact with deltoid firing.  Active motion equivalent to passive motion of the shoulder.  Specialty Comments:  No specialty comments available.  Imaging: No results found.   PMFS History: Patient Active Problem List   Diagnosis Date Noted   Lumbar radiculopathy 06/12/2023   Unspecified rotator cuff tear or rupture of right shoulder, not specified as traumatic 05/29/2023   Pain in right shoulder 04/12/2023   Osteoarthritis of knees, bilateral 05/30/2022   Plantar fasciitis of right foot 06/01/2019   Achilles tendinitis, right leg 06/01/2019   History of breast cancer 06/13/2017   Type 2 diabetes mellitus (HCC) 06/13/2017   Breast cancer, right breast (HCC) 05/14/2011   Past Medical History:  Diagnosis Date   Anemia    Breast cancer (HCC)    Personal history of radiation therapy    S/P rotator cuff repair     Family History  Problem Relation Age of Onset   Hypertension Mother    Heart disease Father     Past Surgical History:  Procedure Laterality Date   ABDOMINAL HYSTERECTOMY     BREAST LUMPECTOMY Right    BREAST SURGERY     partial mastectomy   CHOLECYSTECTOMY     ROTATOR CUFF REPAIR     Social History   Occupational History   Not on file  Tobacco Use   Smoking status: Never   Smokeless tobacco: Never  Substance and Sexual Activity   Alcohol use: No   Drug use: No   Sexual activity: Not on file

## 2024-01-09 ENCOUNTER — Other Ambulatory Visit: Payer: Self-pay | Admitting: Podiatry
